# Patient Record
Sex: Male | Born: 1945 | Race: White | Hispanic: No | Marital: Married | Smoking: Former smoker
Health system: Southern US, Community
[De-identification: ages and names within clinical notes are randomized; demographics above are authoritative.]

## PROBLEM LIST (undated history)

## (undated) DIAGNOSIS — Z789 Other specified health status: Secondary | ICD-10-CM

## (undated) DIAGNOSIS — E119 Type 2 diabetes mellitus without complications: Secondary | ICD-10-CM

## (undated) DIAGNOSIS — F32A Depression, unspecified: Secondary | ICD-10-CM

## (undated) DIAGNOSIS — S42301A Unspecified fracture of shaft of humerus, right arm, initial encounter for closed fracture: Secondary | ICD-10-CM

## (undated) DIAGNOSIS — E782 Mixed hyperlipidemia: Secondary | ICD-10-CM

## (undated) DIAGNOSIS — I700 Atherosclerosis of aorta: Secondary | ICD-10-CM

## (undated) DIAGNOSIS — H269 Unspecified cataract: Secondary | ICD-10-CM

## (undated) DIAGNOSIS — I10 Essential (primary) hypertension: Secondary | ICD-10-CM

## (undated) DIAGNOSIS — N200 Calculus of kidney: Secondary | ICD-10-CM

## (undated) DIAGNOSIS — H3530 Unspecified macular degeneration: Secondary | ICD-10-CM

## (undated) HISTORY — DX: Unspecified fracture of shaft of humerus, right arm, initial encounter for closed fracture: S42.301A

## (undated) HISTORY — DX: Unspecified macular degeneration: H35.30

## (undated) HISTORY — DX: Atherosclerosis of aorta: I70.0

## (undated) HISTORY — PX: APPENDECTOMY: SHX54

## (undated) HISTORY — DX: Type 2 diabetes mellitus without complications: E11.9

## (undated) HISTORY — DX: Calculus of kidney: N20.0

## (undated) HISTORY — PX: OTHER SURGICAL HISTORY: SHX169

## (undated) HISTORY — DX: Depression, unspecified: F32.A

## (undated) HISTORY — PX: TONSILLECTOMY: SUR1361

## (undated) HISTORY — DX: Mixed hyperlipidemia: E78.2

## (undated) HISTORY — DX: Unspecified cataract: H26.9

## (undated) HISTORY — DX: Essential (primary) hypertension: I10

## (undated) HISTORY — DX: Other specified health status: Z78.9

## (undated) HISTORY — PX: COLONOSCOPY: SHX174

---

## 2015-07-26 ENCOUNTER — Encounter (INDEPENDENT_AMBULATORY_CARE_PROVIDER_SITE_OTHER): Payer: Self-pay | Admitting: Ophthalmology

## 2015-08-16 ENCOUNTER — Encounter (INDEPENDENT_AMBULATORY_CARE_PROVIDER_SITE_OTHER): Payer: Self-pay | Admitting: Ophthalmology

## 2017-12-09 ENCOUNTER — Encounter (INDEPENDENT_AMBULATORY_CARE_PROVIDER_SITE_OTHER): Payer: Self-pay | Admitting: Ophthalmology

## 2017-12-09 ENCOUNTER — Ambulatory Visit (INDEPENDENT_AMBULATORY_CARE_PROVIDER_SITE_OTHER): Payer: Medicare Other | Admitting: Ophthalmology

## 2017-12-09 DIAGNOSIS — H43811 Vitreous degeneration, right eye: Secondary | ICD-10-CM

## 2017-12-09 DIAGNOSIS — H25813 Combined forms of age-related cataract, bilateral: Secondary | ICD-10-CM

## 2017-12-09 DIAGNOSIS — H353122 Nonexudative age-related macular degeneration, left eye, intermediate dry stage: Secondary | ICD-10-CM | POA: Diagnosis not present

## 2017-12-09 DIAGNOSIS — H353211 Exudative age-related macular degeneration, right eye, with active choroidal neovascularization: Secondary | ICD-10-CM | POA: Diagnosis not present

## 2017-12-09 DIAGNOSIS — H3581 Retinal edema: Secondary | ICD-10-CM | POA: Diagnosis not present

## 2017-12-09 NOTE — Progress Notes (Signed)
[facility] - Clinic Note  12/09/2017     CHIEF COMPLAINT Patient presents for Retina Evaluation and Diabetic Eye Exam   HISTORY OF PRESENT ILLNESS: Timothy Pruitt is a 72 y.o. male who presents to the clinic today for:   HPI    Retina Evaluation    In both eyes.  This started 3 weeks ago.  Associated Symptoms Negative for Blind Spot, Glare, Shoulder/Hip pain, Fatigue, Jaw Claudication, Photophobia, Distortion, Floaters, Redness, Scalp Tenderness, Weight Loss, Fever, Trauma, Pain and Flashes.  Context:  distance vision, mid-range vision, near vision, reading and driving.  Treatments tried include laser.  Response to treatment was mild improvement.  I, the attending physician,  performed the HPI with the patient and updated documentation appropriately.          Diabetic Eye Exam    Vision is stable.  Associated Symptoms Negative for Blind Spot, Glare, Shoulder/Hip pain, Fatigue, Jaw Claudication, Photophobia, Distortion, Floaters, Redness, Scalp Tenderness, Weight Loss, Fever, Trauma, Pain and Flashes.  Diabetes characteristics include Type 2 and controlled with diet.  This started 6 years ago.  Blood sugar level is controlled.  Last Blood Glucose 159.  Last A1C 6.4.  I, the attending physician,  performed the HPI with the patient and updated documentation appropriately.          Comments    Referral of Dr. Delorise Shiner for retina eval. Patient states he has noticed in past 2-3 weeks his vision has been becoming more blurrier, mostly more so in the OD. Patient stastes he has noticed a spot in his vision OD. Patient is DM2, onset 5-6 yrs ago, his Bs are usually stable, diet controlled, BS 159 after dinner last night, last A1C 6.4. Patient reports he had laser sx OS appx 15 yrs ago with a little improvement noted.Denies gtt's/vit's       Last edited by Rennis Chris, MD on 12/09/2017 10:02 AM. (History)    Pt states he was seen By Dr. Lynelle Doctor for the first time yesterday; Pt  states 2 weeks ago he noticed blurred VA; Pt states he has been noticing wavy lines;   Referring physician: [name], OD [address] [place], [place] [address]  HISTORICAL INFORMATION:   Selected notes from the MEDICAL RECORD NUMBER Referred by Dr. [name] for concern of exudative ARMD OD LEE: 05.07.19 (K. Giusto) [BCVA: OD: 20/40 OS: 20/30-2] Ocular Hx-Exudative ARMD OD, Non-exudative ARMD OS, Cataract OU PMH-DM, HTN, Former Smoker    CURRENT MEDICATIONS: No current outpatient medications on file. (Ophthalmic Drugs)   No current facility-administered medications for this visit.  (Ophthalmic Drugs)   Current Outpatient Medications (Other)  Medication Sig  . aspirin EC 81 MG tablet 1 po q day  . buPROPion (WELLBUTRIN SR) 150 MG 12 hr tablet TK 1 T PO QD  . DOCOSAHEXAENOIC ACID PO Take by mouth.  . doxazosin (CARDURA) 4 MG tablet   . losartan (COZAAR) 100 MG tablet TK 1 T PO QD  . meloxicam (MOBIC) 15 MG tablet TK 1 T PO QD  . sildenafil (REVATIO) 20 MG tablet Take by mouth.   Current Facility-Administered Medications (Other)  Medication Route  . Bevacizumab (AVASTIN) SOLN 1.25 mg Intravitreal      REVIEW OF SYSTEMS: ROS    Positive for: Endocrine, Eyes   Negative for: Constitutional, Gastrointestinal, Neurological, Skin, Genitourinary, Musculoskeletal, HENT, Cardiovascular, Respiratory, Psychiatric, Allergic/Imm, Heme/Lymph   Last edited by Eldridge Scot, LPN on 02/09/2955  8:50 AM. (History)  ALLERGIES Allergies  Allergen Reactions  . Sulfa Antibiotics Hives  . Trazodone And Nefazodone Swelling    PAST MEDICAL HISTORY Past Medical History:  Diagnosis Date  . Diabetes [facility])    Past Surgical History:  Procedure Laterality Date  . APPENDECTOMY      FAMILY HISTORY Family History  Problem Relation Age of Onset  . Cataracts Mother   . Fuch's dystrophy Mother   . Glaucoma Mother   . Diabetes Brother     SOCIAL HISTORY Social History    Tobacco Use  . Smoking status: Former Games developer  . Smokeless tobacco: Never Used  Substance Use Topics  . Alcohol use: Yes    Alcohol/week: 0.6 oz    Types: 1 Cans of beer per week  . Drug use: Never         OPHTHALMIC EXAM:  Base Eye Exam    Visual Acuity (Snellen - Linear)      Right Left   Dist cc 20/30 -2 20/30 -2   Dist ph cc 20/30 -2 20/30 -2   Correction:  Glasses       Tonometry (Tonopen, 9:29 AM)      Right Left   Pressure 13 13       Pupils      Dark Light Shape React APD   Right 3 2 Round Brisk None   Left 3 2 Round Brisk None       Visual Fields (Counting fingers)      Left Right    Full Full       Extraocular Movement      Right Left    Full, Ortho Full, Ortho       Neuro/Psych    Oriented x3:  Yes   Mood/Affect:  Normal       Dilation    Both eyes:  1.0% Mydriacyl, 2.5% Phenylephrine @ 9:28 AM        Slit Lamp and Fundus Exam    Slit Lamp Exam      Right Left   Lids/Lashes Dermatochalasis - upper lid Dermatochalasis - upper lid   Conjunctiva/Sclera White and quiet White and quiet   Cornea Arcus, otherwise clear Arcus, otherwise clear   Anterior Chamber Deep and quiet Deep and quiet   Iris Round and dilated Round and dilated   Lens 2+ Nuclear sclerosis, 2-3+ Cortical cataract 2+ Nuclear sclerosis, 2-3+ Cortical cataract   Vitreous Vitreous syneresis, Posterior vitreous detachment Vitreous syneresis       Fundus Exam      Right Left   Disc Compact, mild Temporal Peripapillary atrophy Pink and Sharp, mild Temporal Peripapillary atrophy   C/D Ratio 0.1 0.1   Macula Blunted foveal reflex, central PED with heme, RPE mottling and clumping, Drusen Blunted foveal reflex, Epiretinal membrane, RPE mottling /  Clumping / atophy, No heme or edema   Vessels Vascular attenuation Vascular attenuation   Periphery Attached, scattered RPE changes Attached, scattered peripherla drusen         Refraction    Wearing Rx      Sphere Cylinder Axis    Right -3.25 +0.50 016   Left -2.75 Sphere    Age:  2 yrs   Type:  SVL       Manifest Refraction      Sphere Cylinder Axis Dist VA   Right -3.00 +1.00 003 20/30   Left -2.75 +1.00 163 20/30          IMAGING AND PROCEDURES  Imaging and  Procedures for @  OCT, Retina - OU - Both Eyes       Right Eye Quality was good. Central Foveal Thickness: 361. Progression has no prior data. Findings include abnormal foveal contour, no IRF, subretinal fluid, pigment epithelial detachment, retinal drusen .   Left Eye Quality was good. Central Foveal Thickness: 307. Progression has no prior data. Findings include normal foveal contour, epiretinal membrane, retinal drusen , pigment epithelial detachment, outer retinal atrophy, no SRF, no IRF.   Notes *Images captured and stored on drive  Diagnosis / Impression:  OD: Exudative ARMD OS: Non- Exudative ARMD with early atrophy; ERM  Clinical management:  See below  Abbreviations: NFP - Normal foveal profile. CME - cystoid macular edema. PED - pigment epithelial detachment. IRF - intraretinal fluid. SRF - subretinal fluid. EZ - ellipsoid zone. ERM - epiretinal membrane. ORA - outer retinal atrophy. ORT - outer retinal tubulation. SRHM - subretinal hyper-reflective material         Intravitreal Injection, Pharmacologic Agent - OD - Right Eye       Time Out 12/09/2017. 10:59 AM. Confirmed correct patient, procedure, site, and patient consented.   Anesthesia Topical anesthesia was used. Anesthetic medications included Lidocaine 2%, Tetracaine 0.5%.   Procedure Preparation included 5% betadine to ocular surface. A supplied needle was used.   Injection: 1.25 mg Bevacizumab (AVASTIN) SOLN 1.25 mg   NDC: 16109-604-54    Lot: [id number]     Expiration Date: 01/27/2018   Route: Intravitreal   Site: Right Eye   Waste: 0 mg  Post-op Post injection exam found visual acuity of at least counting fingers. The patient tolerated the  procedure well. There were no complications. The patient received written and verbal post procedure care education.                 ASSESSMENT/PLAN:    ICD-10-CM   1. 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 Retinal edema H35.81 OCT, Retina - OU - Both Eyes  3. Intermediate stage nonexudative age-related macular degeneration of left eye H35.3122   4. Posterior vitreous detachment of right eye H43.811   5. 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 Exudative age related macular degeneration, OD  - The incidence pathology and anatomy of wet AMD discussed   - The ANCHOR, MARINA, CATT and VIEW trials discussed with patient.    - discussed treatment options including observation vs intravitreal anti-VEGF agents such as Avastin, Lucentis, Eylea.    - Risks of endophthalmitis and vascular occlusive events and atrophic changes discussed with patient  - pt endorses metamorphopsia   - OCT OD with PED and overlying SRF + drusen  - recommend IVA #1 OD  - pt wishes to be treated with IVA OD today (05.08.19)  - RBA of procedure discussed, questions answered  - informed consent obtained and signed  - see procedure note  - f/u in 4 wks  3. Age related macular degeneration, non-exudative, OS  - The incidence, anatomy, and pathology of dry AMD, risk of progression, and the AREDS and AREDS 2 study including smoking risks discussed with patient.  - Recommend amsler grid monitoring  4. PVD / vitreous syneresis  Discussed findings and prognosis  No RT or RD on 360 peripheral exam  Reviewed s/s of RT/RD  Strict return precautions  for any such RT/RD signs/symptoms  5. Combined form age-related cataract OU-  - The symptoms of cataract, surgical options, and treatments and risks were discussed with  patient. - discussed diagnosis and progression - not yet visually significant - monitor for now  Ophthalmic Meds Ordered this visit:  Meds ordered this encounter  Medications  . Bevacizumab (AVASTIN) SOLN 1.25 mg       Return in about 1 month (around 01/06/2018) for F/U Exu AMD OD, DFE, OCT.  There are no Patient Instructions on file for this visit.   Explained the diagnoses, plan, and follow up with the patient and they expressed understanding.  Patient expressed understanding of the importance of proper follow up care.   This document serves as a record of services personally performed by Karie Chimera, MD, PhD. It was created on their behalf by Laurian Brim, OA, an ophthalmic assistant. The creation of this record is the provider's dictation and/or activities during the visit.    Electronically signed by: Laurian Brim, OA  12/09/2017 3:23 PM   This document serves as a record of services personally performed by Karie Chimera, MD, PhD. It was created on their behalf by Virgilio Belling, COA, a certified ophthalmic assistant. The creation of this record is the provider's dictation and/or activities during the visit.  Electronically signed by: Virgilio Belling, COA  05.08.19 3:23 PM    Karie Chimera, M.D., Ph.D. Diseases & Surgery of the Retina and Vitreous [facility] 05.08.19  I have reviewed the above documentation for accuracy and completeness, and I agree with the above. Karie Chimera, M.D., Ph.D. 12/12/17 3:24 PM     Abbreviations: M myopia (nearsighted); A astigmatism; H hyperopia (farsighted); P presbyopia; Mrx spectacle prescription;  CTL contact lenses; OD right eye; OS left eye; OU both eyes  XT exotropia; ET esotropia; PEK punctate epithelial keratitis; PEE punctate epithelial erosions; DES dry eye syndrome; MGD meibomian gland dysfunction; ATs artificial tears; PFAT's preservative free artificial tears; NSC nuclear sclerotic cataract;  PSC posterior subcapsular cataract; ERM epi-retinal membrane; PVD posterior vitreous detachment; RD retinal detachment; DM diabetes mellitus; DR diabetic retinopathy; NPDR non-proliferative diabetic retinopathy; PDR proliferative diabetic retinopathy; CSME clinically significant macular edema; DME diabetic macular edema; dbh dot blot hemorrhages; CWS cotton wool spot; POAG primary open angle glaucoma; C/D cup-to-disc ratio; HVF humphrey visual field; GVF goldmann visual field; OCT optical coherence tomography; IOP intraocular pressure; BRVO Branch retinal vein occlusion; CRVO central retinal vein occlusion; CRAO central retinal artery occlusion; BRAO branch retinal artery occlusion; RT retinal tear; SB scleral buckle; PPV pars plana vitrectomy; VH Vitreous hemorrhage; PRP panretinal laser photocoagulation; IVK intravitreal kenalog; VMT vitreomacular traction; MH Macular hole;  NVD neovascularization of the disc; NVE neovascularization elsewhere; AREDS age related eye disease study; ARMD age related macular degeneration; POAG primary open angle glaucoma; EBMD epithelial/anterior basement membrane dystrophy; ACIOL anterior chamber intraocular lens; IOL intraocular lens; PCIOL posterior chamber intraocular lens; Phaco/IOL phacoemulsification with intraocular lens placement; PRK photorefractive keratectomy; LASIK laser assisted in situ keratomileusis; HTN hypertension; DM diabetes mellitus; COPD chronic obstructive pulmonary disease

## 2017-12-12 ENCOUNTER — Encounter (INDEPENDENT_AMBULATORY_CARE_PROVIDER_SITE_OTHER): Payer: Self-pay | Admitting: Ophthalmology

## 2017-12-12 DIAGNOSIS — H353211 Exudative age-related macular degeneration, right eye, with active choroidal neovascularization: Secondary | ICD-10-CM | POA: Diagnosis not present

## 2017-12-12 MED ORDER — BEVACIZUMAB CHEMO INJECTION 1.25MG/0.05ML SYRINGE FOR KALEIDOSCOPE
1.2500 mg | INTRAVITREAL | Status: DC
  Administered 2017-12-12: 1.25 mg via INTRAVITREAL

## 2018-01-06 NOTE — Progress Notes (Signed)
[facility] - Clinic Note  01/07/2018     CHIEF COMPLAINT Patient presents for Retina Follow Up   HISTORY OF PRESENT ILLNESS: Timothy Pruitt is a 72 y.o. male who presents to the clinic today for:   HPI    Retina Follow Up    Patient presents with  Wet AMD.  In right eye.  Severity is mild.  Since onset it is gradually improving.  I, the attending physician,  performed the HPI with the patient and updated documentation appropriately.          Comments    F/U EXU AMD OD. Patient states his vision is gradually getting better, wavy lines and blurriness has decreased. Bs 138( 1 wk ago), A1C 6.8(01/06/18) Pt started Areds 2 PO QD. Patient is ready for Avastin if indicted.       Last edited by Rennis ChrisZamora, Athan Casalino, MD on 01/07/2018 10:28 AM. (History)    Pt states after first injection he was able to see "lines in the road that weren't squiggly"; Pt states he tolerated injection well;   Referring physician: No referring provider defined for this encounter.  HISTORICAL INFORMATION:   Selected notes from the MEDICAL RECORD NUMBER Referred by Dr. Jonny RuizKenneth Giusto for concern of exudative ARMD OD LEE: 05.07.19 (K. Giusto) [BCVA: OD: 20/40 OS: 20/30-2] Ocular Hx-Exudative ARMD OD, Non-exudative ARMD OS, Cataract OU PMH-DM, HTN, Former Smoker    CURRENT MEDICATIONS: No current outpatient medications on file. (Ophthalmic Drugs)   No current facility-administered medications for this visit.  (Ophthalmic Drugs)   Current Outpatient Medications (Other)  Medication Sig  . aspirin EC 81 MG tablet 1 po q day  . buPROPion (WELLBUTRIN SR) 150 MG 12 hr tablet TK 1 T PO QD  . DOCOSAHEXAENOIC ACID PO Take by mouth.  . doxazosin (CARDURA) 4 MG tablet   . losartan (COZAAR) 100 MG tablet TK 1 T PO QD  . meloxicam (MOBIC) 15 MG tablet TK 1 T PO QD  . Multiple Vitamins-Minerals (ICAPS AREDS 2 PO) Take by mouth.  . sildenafil (REVATIO) 20 MG tablet Take by mouth.   Current  Facility-Administered Medications (Other)  Medication Route  . Bevacizumab (AVASTIN) SOLN 1.25 mg Intravitreal  . Bevacizumab (AVASTIN) SOLN 1.25 mg Intravitreal      REVIEW OF SYSTEMS: ROS    Positive for: Endocrine, Eyes   Negative for: Constitutional, Gastrointestinal, Neurological, Skin, Genitourinary, Musculoskeletal, HENT, Cardiovascular, Respiratory, Psychiatric, Allergic/Imm, Heme/Lymph   Last edited by Eldridge ScotKendrick, Glenda, LPN on 1/6/10966/01/2018  9:54 AM. (History)       ALLERGIES Allergies  Allergen Reactions  . Sulfa Antibiotics Hives  . Trazodone And Nefazodone Swelling    PAST MEDICAL HISTORY Past Medical History:  Diagnosis Date  . Diabetes [facility](HCC)    Past Surgical History:  Procedure Laterality Date  . APPENDECTOMY      FAMILY HISTORY Family History  Problem Relation Age of Onset  . Cataracts Mother   . Fuch's dystrophy Mother   . Glaucoma Mother   . Diabetes Brother     SOCIAL HISTORY Social History   Tobacco Use  . Smoking status: Former Games developermoker  . Smokeless tobacco: Never Used  Substance Use Topics  . Alcohol use: Yes    Alcohol/week: 0.6 oz    Types: 1 Cans of beer per week  . Drug use: Never         OPHTHALMIC EXAM:  Base Eye Exam    Visual Acuity (Snellen - Linear)  Right Left   Dist cc 20/30 -2 20/40 -   Dist ph cc NI NI   Correction:  Glasses       Tonometry (Tonopen, 10:02 AM)      Right Left   Pressure 13 11       Pupils      Dark Light Shape React APD   Right 4 3 Round Brisk None   Left 4 3 Round Brisk None       Visual Fields (Counting fingers)      Left Right    Full Full       Extraocular Movement      Right Left    Full, Ortho Full, Ortho       Neuro/Psych    Oriented x3:  Yes   Mood/Affect:  Normal       Dilation    Both eyes:  1.0% Mydriacyl, 2.5% Phenylephrine @ 10:02 AM        Slit Lamp and Fundus Exam    Slit Lamp Exam      Right Left   Lids/Lashes Dermatochalasis - upper lid  Dermatochalasis - upper lid   Conjunctiva/Sclera White and quiet White and quiet   Cornea Arcus, otherwise clear Arcus, otherwise clear   Anterior Chamber Deep and quiet Deep and quiet   Iris Round and dilated Round and dilated   Lens 2+ Nuclear sclerosis, 2-3+ Cortical cataract 2+ Nuclear sclerosis, 2-3+ Cortical cataract   Vitreous Vitreous syneresis, Posterior vitreous detachment Vitreous syneresis       Fundus Exam      Right Left   Disc Compact, mild Temporal Peripapillary atrophy Pink and Sharp, mild Temporal Peripapillary atrophy   C/D Ratio 0.1 0.1   Macula Blunted foveal reflex, central PED with heme - heme improving, RPE mottling and clumping, Drusen Blunted foveal reflex, Epiretinal membrane, RPE mottling /  Clumping / atophy, No heme or edema   Vessels Vascular attenuation Vascular attenuation   Periphery Attached, scattered RPE changes Attached, scattered peripherla drusen           IMAGING AND PROCEDURES  Imaging and Procedures for @TODAY @  OCT, Retina - OU - Both Eyes       Right Eye Quality was good. Central Foveal Thickness: 330. Progression has improved. Findings include abnormal foveal contour, no IRF, pigment epithelial detachment, retinal drusen , no SRF, outer retinal atrophy.   Left Eye Quality was good. Central Foveal Thickness: 306. Progression has been stable. Findings include normal foveal contour, epiretinal membrane, retinal drusen , pigment epithelial detachment, outer retinal atrophy, no SRF, no IRF, subretinal hyper-reflective material.   Notes *Images captured and stored on drive  Diagnosis / Impression:  OD: Exudative ARMD - interval improvement in SRF OS: Non- Exudative ARMD with early atrophy; ERM  Clinical management:  See below  Abbreviations: NFP - Normal foveal profile. CME - cystoid macular edema. PED - pigment epithelial detachment. IRF - intraretinal fluid. SRF - subretinal fluid. EZ - ellipsoid zone. ERM - epiretinal membrane.  ORA - outer retinal atrophy. ORT - outer retinal tubulation. SRHM - subretinal hyper-reflective material         Intravitreal Injection, Pharmacologic Agent - OD - Right Eye       Time Out 01/07/2018. 10:51 AM. Confirmed correct patient, procedure, site, and patient consented.   Anesthesia Topical anesthesia was used. Anesthetic medications included Lidocaine 2%, Proparacaine 0.5%.   Procedure Preparation included 5% betadine to ocular surface. A supplied needle was used.  Injection: 1.25 mg Bevacizumab 1.25mg /0.40ml   NDC: 16109-604-54    Lot: [id number]     Expiration Date: 03/11/2018   Route: Intravitreal   Site: Right Eye   Waste: 0 mg  Post-op Post injection exam found visual acuity of at least counting fingers. The patient tolerated the procedure well. There were no complications. The patient received written and verbal post procedure care education.                 ASSESSMENT/PLAN:    ICD-10-CM   1. 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 Retinal edema H35.81 OCT, Retina - OU - Both Eyes  3. Intermediate stage nonexudative age-related macular degeneration of left eye H35.3122   4. Posterior vitreous detachment of right eye H43.811   5. 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 Exudative age related macular degeneration, OD  - at initial presentation, pt endorsed metamorphopsia   - OCT OD with PED and overlying SRF + drusen  - S/P IVA OD #1 (05.08.19) -- excellent early response!  - today, pt reports subjective resolution of metamorphopsia and OCT shows interval resolution of SRF -- still with PED  - recommend IVA #2 OD  - pt wishes to be treated with IVA #2 OD today (06.06.19)  - RBA of procedure discussed, questions answered  - informed consent obtained  and signed  - see procedure note  - f/u in 4 wks  3. Age related macular degeneration, non-exudative, OS  - The incidence, anatomy, and pathology of dry AMD, risk of progression, and the AREDS and AREDS 2 study including smoking risks discussed with patient.  - continue amsler gird monitoring  4. PVD / vitreous syneresis  Discussed findings and prognosis  No RT or RD on 360 peripheral exam  Reviewed s/s of RT/RD  Strict return precautions for any such RT/RD signs/symptoms  5. Combined form age-related cataract OU-  - The symptoms of cataract, surgical options, and treatments and risks were discussed with patient. - discussed diagnosis and progression - not yet visually significant - monitor for now  Ophthalmic Meds Ordered this visit:  Meds ordered this encounter  Medications  . Bevacizumab (AVASTIN) SOLN 1.25 mg       Return in about 1 month (around 02/04/2018) for F/U Exu AMD OD, DFE, OCT.  There are no Patient Instructions on file for this visit.   Explained the diagnoses, plan, and follow up with the patient and they expressed understanding.  Patient expressed understanding of the importance of proper follow up care.   This document serves as a record of services personally performed by Karie Chimera, MD, PhD. It was created on their behalf by Virgilio Belling, COA, a certified ophthalmic assistant. The creation of this record is the provider's dictation and/or activities during the visit.  Electronically signed by: Virgilio Belling, COA  06.05.19 1:02 PM   Karie Chimera, M.D., Ph.D. Diseases & Surgery of the Retina and Vitreous [facility]  I have reviewed the above documentation for accuracy and completeness, and I agree with the above. Karie Chimera, M.D., Ph.D. 01/08/18 1:04 PM   Abbreviations: M myopia (nearsighted); A astigmatism; H hyperopia (farsighted); P presbyopia; Mrx spectacle prescription;  CTL contact lenses; OD right eye; OS  left eye; OU both eyes  XT exotropia; ET esotropia; PEK punctate epithelial keratitis; PEE punctate epithelial erosions; DES dry eye syndrome; MGD meibomian gland dysfunction; ATs artificial tears; PFAT's preservative free artificial tears; [place] nuclear sclerotic cataract; PSC posterior subcapsular cataract; ERM epi-retinal membrane; PVD posterior vitreous detachment; RD retinal detachment; DM diabetes mellitus; DR diabetic retinopathy; NPDR non-proliferative diabetic retinopathy; PDR proliferative diabetic retinopathy; CSME clinically significant macular edema; DME diabetic macular edema; dbh dot blot hemorrhages; CWS cotton wool spot; POAG primary open angle glaucoma; C/D cup-to-disc ratio; HVF humphrey visual field; GVF goldmann visual field; OCT optical coherence tomography; IOP intraocular pressure; BRVO Branch retinal vein occlusion; CRVO central retinal vein occlusion; CRAO central retinal artery occlusion; BRAO branch retinal artery occlusion; RT retinal tear; SB scleral buckle; PPV pars plana vitrectomy; VH Vitreous hemorrhage; PRP panretinal laser photocoagulation; IVK intravitreal kenalog; VMT vitreomacular traction; MH Macular hole;  NVD neovascularization of the disc; NVE neovascularization elsewhere; AREDS age related eye disease study; ARMD age related macular degeneration; POAG primary open angle glaucoma; EBMD epithelial/anterior basement membrane dystrophy; ACIOL anterior chamber intraocular lens; IOL intraocular lens; PCIOL posterior chamber intraocular lens; Phaco/IOL phacoemulsification with intraocular lens placement; [place] photorefractive keratectomy; LASIK laser assisted in situ keratomileusis; HTN hypertension; DM diabetes mellitus; COPD chronic obstructive pulmonary disease

## 2018-01-07 ENCOUNTER — Ambulatory Visit (INDEPENDENT_AMBULATORY_CARE_PROVIDER_SITE_OTHER): Payer: Medicare Other | Admitting: Ophthalmology

## 2018-01-07 ENCOUNTER — Encounter (INDEPENDENT_AMBULATORY_CARE_PROVIDER_SITE_OTHER): Payer: Self-pay | Admitting: Ophthalmology

## 2018-01-07 DIAGNOSIS — H353211 Exudative age-related macular degeneration, right eye, with active choroidal neovascularization: Secondary | ICD-10-CM

## 2018-01-07 DIAGNOSIS — H353122 Nonexudative age-related macular degeneration, left eye, intermediate dry stage: Secondary | ICD-10-CM

## 2018-01-07 DIAGNOSIS — H43811 Vitreous degeneration, right eye: Secondary | ICD-10-CM

## 2018-01-07 DIAGNOSIS — H25813 Combined forms of age-related cataract, bilateral: Secondary | ICD-10-CM

## 2018-01-07 DIAGNOSIS — H3581 Retinal edema: Secondary | ICD-10-CM | POA: Diagnosis not present

## 2018-01-07 MED ORDER — BEVACIZUMAB CHEMO INJECTION 1.25MG/0.05ML SYRINGE FOR KALEIDOSCOPE
1.2500 mg | INTRAVITREAL | Status: DC
  Administered 2018-01-07: 1.25 mg via INTRAVITREAL

## 2018-01-08 ENCOUNTER — Encounter (INDEPENDENT_AMBULATORY_CARE_PROVIDER_SITE_OTHER): Payer: Self-pay | Admitting: Ophthalmology

## 2018-02-02 NOTE — Progress Notes (Signed)
[facility] - Clinic Note  02/03/2018     CHIEF COMPLAINT Patient presents for Retina Follow Up   HISTORY OF PRESENT ILLNESS: Carney CornersLarry Pruitt is a 72 y.o. male who presents to the clinic today for:   HPI    Retina Follow Up    Patient presents with  Wet AMD.  In right eye.  This started months ago.  Severity is moderate.  Duration of months.  Since onset it is stable.  I, the attending physician,  performed the HPI with the patient and updated documentation appropriately.          Comments    72 y/o male pt here for 1 mo f/u for wet AMD OD w/active choroidal neovascularization.  Feels vision OU may be a little better since last visit.  Denies pain, flashes, but reports a few small floaters OU.  No gtts.       Last edited by Rennis ChrisZamora, Laporsha Grealish, MD on 02/03/2018 11:14 AM. (History)      Referring physician: Theodoro KosLewis, William B, MD [address] [place], [place] [id number]  HISTORICAL INFORMATION:   Selected notes from the MEDICAL RECORD NUMBER Referred by Dr. Jonny RuizKenneth Giusto for concern of exudative ARMD OD LEE: 05.07.19 (K. Giusto) [BCVA: OD: 20/40 OS: 20/30-2] Ocular Hx-Exudative ARMD OD, Non-exudative ARMD OS, Cataract OU PMH-DM, HTN, Former Smoker    CURRENT MEDICATIONS: No current outpatient medications on file. (Ophthalmic Drugs)   No current facility-administered medications for this visit.  (Ophthalmic Drugs)   Current Outpatient Medications (Other)  Medication Sig  . amoxicillin-clavulanate (AUGMENTIN) 875-125 MG tablet TK 1 T PO  BID WITH FOOD FOR INFECTION  . aspirin EC 81 MG tablet 1 po q day  . buPROPion (WELLBUTRIN SR) 150 MG 12 hr tablet TK 1 T PO QD  . DOCOSAHEXAENOIC ACID PO Take by mouth.  . doxazosin (CARDURA) 4 MG tablet   . losartan (COZAAR) 100 MG tablet TK 1 T PO QD  . meloxicam (MOBIC) 15 MG tablet TK 1 T PO QD  . Multiple Vitamins-Minerals (ICAPS AREDS 2 PO) Take by mouth.  . sildenafil (REVATIO) 20 MG tablet Take by mouth.   Current  Facility-Administered Medications (Other)  Medication Route  . Bevacizumab (AVASTIN) SOLN 1.25 mg Intravitreal  . Bevacizumab (AVASTIN) SOLN 1.25 mg Intravitreal  . Bevacizumab (AVASTIN) SOLN 1.25 mg Intravitreal      REVIEW OF SYSTEMS: ROS    Positive for: Eyes   Negative for: Constitutional, Gastrointestinal, Neurological, Skin, Genitourinary, Musculoskeletal, HENT, Endocrine, Cardiovascular, Respiratory, Psychiatric, Allergic/Imm, Heme/Lymph   Last edited by Celine MansBaxley, Andrew G, COA on 02/03/2018 10:05 AM. (History)       ALLERGIES Allergies  Allergen Reactions  . Sulfa Antibiotics Hives  . Trazodone And Nefazodone Swelling    PAST MEDICAL HISTORY Past Medical History:  Diagnosis Date  . Diabetes [facility](HCC)    Past Surgical History:  Procedure Laterality Date  . APPENDECTOMY      FAMILY HISTORY Family History  Problem Relation Age of Onset  . Cataracts Mother   . Fuch's dystrophy Mother   . Glaucoma Mother   . Diabetes Brother     SOCIAL HISTORY Social History   Tobacco Use  . Smoking status: Former Games developermoker  . Smokeless tobacco: Never Used  Substance Use Topics  . Alcohol use: Yes    Alcohol/week: 0.6 oz    Types: 1 Cans of beer per week  . Drug use: Never  OPHTHALMIC EXAM:  Base Eye Exam    Visual Acuity (Snellen - Linear)      Right Left   Dist cc 20/30 20/30 -2   Dist ph cc NI NI   Correction:  Glasses       Tonometry (Tonopen, 10:13 AM)      Right Left   Pressure 14 13  Squeezing       Pupils      Dark Light Shape React APD   Right 4 3 Round Brisk None   Left 4 3 Round Brisk None       Visual Fields (Counting fingers)      Left Right    Full Full       Extraocular Movement      Right Left    Full, Ortho Full, Ortho       Neuro/Psych    Oriented x3:  Yes   Mood/Affect:  Normal       Dilation    Both eyes:  1.0% Mydriacyl, 2.5% Phenylephrine @ 10:13 AM        Slit Lamp and Fundus Exam    Slit Lamp Exam       Right Left   Lids/Lashes Dermatochalasis - upper lid Dermatochalasis - upper lid   Conjunctiva/Sclera White and quiet White and quiet   Cornea Arcus, otherwise clear Arcus, otherwise clear   Anterior Chamber Deep and quiet Deep and quiet   Iris Round and dilated Round and dilated   Lens 2+ Nuclear sclerosis, 2-3+ Cortical cataract 2+ Nuclear sclerosis, 2-3+ Cortical cataract   Vitreous Vitreous syneresis, Posterior vitreous detachment Vitreous syneresis       Fundus Exam      Right Left   Disc Compact, mild Temporal Peripapillary atrophy Pink and Sharp, mild Temporal Peripapillary atrophy   C/D Ratio 0.1 0.1   Macula Blunted foveal reflex, central PED with heme improving - essentially resolved, RPE mottling and clumping, Drusen Blunted foveal reflex, Epiretinal membrane, RPE mottling /  Clumping / atophy, No heme or edema   Vessels Vascular attenuation Vascular attenuation   Periphery Attached, scattered RPE changes Attached, scattered peripheral drusen           IMAGING AND PROCEDURES  Imaging and Procedures for @TODAY @  OCT, Retina - OU - Both Eyes       Right Eye Quality was good. Central Foveal Thickness: 330. Progression has improved. Findings include abnormal foveal contour, no IRF, pigment epithelial detachment, retinal drusen , no SRF, outer retinal atrophy.   Left Eye Quality was good. Central Foveal Thickness: 278. Progression has been stable. Findings include normal foveal contour, epiretinal membrane, retinal drusen , pigment epithelial detachment, outer retinal atrophy, no SRF, no IRF, subretinal hyper-reflective material.   Notes *Images captured and stored on drive  Diagnosis / Impression:  OD: Exudative ARMD - stable resolution of SRF OS: Non- Exudative ARMD with early atrophy; ERM  Clinical management:  See below  Abbreviations: NFP - Normal foveal profile. CME - cystoid macular edema. PED - pigment epithelial detachment. IRF - intraretinal fluid. SRF -  subretinal fluid. EZ - ellipsoid zone. ERM - epiretinal membrane. ORA - outer retinal atrophy. ORT - outer retinal tubulation. SRHM - subretinal hyper-reflective material         Intravitreal Injection, Pharmacologic Agent - OD - Right Eye       Time Out 02/03/2018. 11:17 AM. Confirmed correct patient, procedure, site, and patient consented.   Anesthesia Topical anesthesia was used. Anesthetic medications included  Lidocaine 2%, Tetracaine 0.5%.   Procedure Preparation included 5% betadine to ocular surface, eyelid speculum. A supplied needle was used.   Injection: 1.25 mg Bevacizumab 1.25mg /0.36ml   NDC: 70360-001-02    Lot: [id number]     Expiration Date: 03/18/2018   Route: Intravitreal   Site: Right Eye   Waste: 0 mg  Post-op Post injection exam found visual acuity of at least counting fingers. The patient tolerated the procedure well. There were no complications. The patient received written and verbal post procedure care education.                 ASSESSMENT/PLAN:    ICD-10-CM   1. Exudative age-related macular degeneration of right eye with active choroidal neovascularization (HCC) H35.3211 OCT, Retina - OU - Both Eyes    Intravitreal Injection, Pharmacologic Agent - OD - Right Eye    Bevacizumab (AVASTIN) SOLN 1.25 mg  2. Retinal edema H35.81 OCT, Retina - OU - Both Eyes  3. Intermediate stage nonexudative age-related macular degeneration of left eye H35.3122   4. Posterior vitreous detachment of right eye H43.811   5. 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 Exudative age related macular degeneration, OD  - at initial presentation, pt endorsed metamorphopsia   - OCT OD with PED and overlying SRF + drusen  - S/P IVA OD #1 (05.08.19) -- excellent early response!, #2 (06.06.19)  - today, pt reports stable resolution of metamorphopsia and OCT shows interval resolution of SRF -- still with PED  - recommend IVA #3 OD with extension  to 6 wks  - pt wishes to be treated with IVA #3 OD today (07.03.19)  - RBA of procedure discussed, questions answered  - informed consent obtained and signed  - see procedure note  - will extend follow up  - f/u in 6 wks  3. Age related macular degeneration, non-exudative, OS  - The incidence, anatomy, and pathology of dry AMD, risk of progression, and the AREDS and AREDS 2 study including smoking risks discussed with patient.  - continue amsler grid monitoring  4. PVD / vitreous syneresis  Discussed findings and prognosis  No RT or RD on 360 peripheral exam  Reviewed s/s of RT/RD  Strict return precautions for any such RT/RD signs/symptoms  5. Combined form age-related cataract OU-  - The symptoms of cataract, surgical options, and treatments and risks were discussed with patient. - discussed diagnosis and progression - not yet visually significant - monitor for now   Ophthalmic Meds Ordered this visit:  Meds ordered this encounter  Medications  . Bevacizumab (AVASTIN) SOLN 1.25 mg       Return in about 6 weeks (around 03/17/2018) for F/U exu AMD OD, DFE, OCT.  There are no Patient Instructions on file for this visit.   Explained the diagnoses, plan, and follow up with the patient and they expressed understanding.  Patient expressed understanding of the importance of proper follow up care.   This document serves as a record of services personally performed by Karie Chimera, MD, PhD. It was created on their behalf by Virgilio Belling, COA, a certified ophthalmic assistant. The creation of this record is the provider's dictation and/or activities during the visit.  Electronically signed by: Virgilio Belling, COA  07.02.19 12:39 PM    Karie Chimera, M.D., Ph.D. Diseases & Surgery of the Retina and Vitreous [facility]  I have reviewed the above documentation for  accuracy and completeness, and I agree with the above. Karie Chimera, M.D., Ph.D.  02/03/18 12:39 PM   Abbreviations: M myopia (nearsighted); A astigmatism; H hyperopia (farsighted); P presbyopia; Mrx spectacle prescription;  CTL contact lenses; OD right eye; OS left eye; OU both eyes  XT exotropia; ET esotropia; PEK punctate epithelial keratitis; PEE punctate epithelial erosions; DES dry eye syndrome; MGD meibomian gland dysfunction; ATs artificial tears; PFAT's preservative free artificial tears; [place] nuclear sclerotic cataract; PSC posterior subcapsular cataract; ERM epi-retinal membrane; PVD posterior vitreous detachment; RD retinal detachment; DM diabetes mellitus; DR diabetic retinopathy; NPDR non-proliferative diabetic retinopathy; PDR proliferative diabetic retinopathy; CSME clinically significant macular edema; DME diabetic macular edema; dbh dot blot hemorrhages; CWS cotton wool spot; POAG primary open angle glaucoma; C/D cup-to-disc ratio; HVF humphrey visual field; GVF goldmann visual field; OCT optical coherence tomography; IOP intraocular pressure; BRVO Branch retinal vein occlusion; CRVO central retinal vein occlusion; CRAO central retinal artery occlusion; BRAO branch retinal artery occlusion; RT retinal tear; SB scleral buckle; PPV pars plana vitrectomy; VH Vitreous hemorrhage; PRP panretinal laser photocoagulation; IVK intravitreal kenalog; VMT vitreomacular traction; MH Macular hole;  NVD neovascularization of the disc; NVE neovascularization elsewhere; AREDS age related eye disease study; ARMD age related macular degeneration; POAG primary open angle glaucoma; EBMD epithelial/anterior basement membrane dystrophy; ACIOL anterior chamber intraocular lens; IOL intraocular lens; PCIOL posterior chamber intraocular lens; Phaco/IOL phacoemulsification with intraocular lens placement; PRK photorefractive keratectomy; LASIK laser assisted in situ keratomileusis; HTN hypertension; DM diabetes mellitus; COPD chronic obstructive pulmonary disease

## 2018-02-03 ENCOUNTER — Ambulatory Visit (INDEPENDENT_AMBULATORY_CARE_PROVIDER_SITE_OTHER): Payer: Medicare Other | Admitting: Ophthalmology

## 2018-02-03 ENCOUNTER — Encounter (INDEPENDENT_AMBULATORY_CARE_PROVIDER_SITE_OTHER): Payer: Self-pay | Admitting: Ophthalmology

## 2018-02-03 DIAGNOSIS — H353211 Exudative age-related macular degeneration, right eye, with active choroidal neovascularization: Secondary | ICD-10-CM | POA: Diagnosis not present

## 2018-02-03 DIAGNOSIS — H25813 Combined forms of age-related cataract, bilateral: Secondary | ICD-10-CM

## 2018-02-03 DIAGNOSIS — H3581 Retinal edema: Secondary | ICD-10-CM | POA: Diagnosis not present

## 2018-02-03 DIAGNOSIS — H353122 Nonexudative age-related macular degeneration, left eye, intermediate dry stage: Secondary | ICD-10-CM

## 2018-02-03 DIAGNOSIS — H43811 Vitreous degeneration, right eye: Secondary | ICD-10-CM

## 2018-02-03 MED ORDER — BEVACIZUMAB CHEMO INJECTION 1.25MG/0.05ML SYRINGE FOR KALEIDOSCOPE
1.2500 mg | INTRAVITREAL | Status: DC
  Administered 2018-02-03: 1.25 mg via INTRAVITREAL

## 2018-03-15 NOTE — Progress Notes (Signed)
[facility] - Clinic Note  03/16/2018     CHIEF COMPLAINT Patient presents for Retina Follow Up   HISTORY OF PRESENT ILLNESS: Timothy Pruitt is a 72 y.o. male who presents to the clinic today for:   HPI    Retina Follow Up    Patient presents with  Wet AMD.  In right eye.  This started 3 months ago.  Severity is mild.  Since onset it is stable.  I, the attending physician,  performed the HPI with the patient and updated documentation appropriately.          Comments    F/U EXU AMD OD. Patient states his vision is"about the same", denies new visual issues. Pt is ready for tx today if indicted.       Last edited by Rennis Chris, MD on 03/16/2018 12:19 PM. (History)      Referring physician: Theodoro Kos, MD [id number] [place], [place] [address]  HISTORICAL INFORMATION:   Selected notes from the MEDICAL RECORD NUMBER Referred by Dr. [name] for concern of exudative ARMD OD LEE: 05.07.19 (K. Giusto) [BCVA: OD: 20/40 OS: 20/30-2] Ocular Hx-Exudative ARMD OD, Non-exudative ARMD OS, Cataract OU PMH-DM, HTN, Former Smoker    CURRENT MEDICATIONS: No current outpatient medications on file. (Ophthalmic Drugs)   No current facility-administered medications for this visit.  (Ophthalmic Drugs)   Current Outpatient Medications (Other)  Medication Sig  . amoxicillin-clavulanate (AUGMENTIN) 875-125 MG tablet TK 1 T PO  BID WITH FOOD FOR INFECTION  . aspirin EC 81 MG tablet 1 po q day  . buPROPion (WELLBUTRIN SR) 150 MG 12 hr tablet TK 1 T PO QD  . DOCOSAHEXAENOIC ACID PO Take by mouth.  . doxazosin (CARDURA) 4 MG tablet   . losartan (COZAAR) 100 MG tablet TK 1 T PO QD  . meloxicam (MOBIC) 15 MG tablet TK 1 T PO QD  . Multiple Vitamins-Minerals (ICAPS AREDS 2 PO) Take by mouth.  . sildenafil (REVATIO) 20 MG tablet Take by mouth.   Current Facility-Administered Medications (Other)  Medication Route  . Bevacizumab (AVASTIN) SOLN 1.25 mg  Intravitreal  . Bevacizumab (AVASTIN) SOLN 1.25 mg Intravitreal  . Bevacizumab (AVASTIN) SOLN 1.25 mg Intravitreal  . Bevacizumab (AVASTIN) SOLN 1.25 mg Intravitreal      REVIEW OF SYSTEMS: ROS    Positive for: Eyes   Negative for: Constitutional, Gastrointestinal, Neurological, Skin, Genitourinary, Musculoskeletal, HENT, Endocrine, Cardiovascular, Respiratory, Psychiatric, Allergic/Imm, Heme/Lymph   Last edited by Eldridge Scot, LPN on 01/06/5408  9:52 AM. (History)       ALLERGIES Allergies  Allergen Reactions  . Sulfa Antibiotics Hives  . Trazodone And Nefazodone Swelling    PAST MEDICAL HISTORY Past Medical History:  Diagnosis Date  . Diabetes [facility])    Past Surgical History:  Procedure Laterality Date  . APPENDECTOMY      FAMILY HISTORY Family History  Problem Relation Age of Onset  . Cataracts Mother   . Fuch's dystrophy Mother   . Glaucoma Mother   . Diabetes Brother     SOCIAL HISTORY Social History   Tobacco Use  . Smoking status: Former Games developer  . Smokeless tobacco: Never Used  Substance Use Topics  . Alcohol use: Yes    Alcohol/week: 1.0 standard drinks    Types: 1 Cans of beer per week  . Drug use: Never         OPHTHALMIC EXAM:  Base Eye Exam    Visual Acuity (Snellen -  Linear)      Right Left   Dist cc 20/30 20/40   Dist ph cc 20/25 20/30   Correction:  Glasses       Tonometry (Tonopen, 9:53 AM)      Right Left   Pressure 12 12       Pupils      Dark Light Shape React APD   Right 4 3 Round Brisk None   Left 4 3 Round Brisk None       Visual Fields (Counting fingers)      Left Right    Full Full       Extraocular Movement      Right Left    Full, Ortho Full, Ortho       Neuro/Psych    Oriented x3:  Yes   Mood/Affect:  Normal       Dilation    Both eyes:  1.0% Mydriacyl, 2.5% Phenylephrine @ 9:54 AM        Slit Lamp and Fundus Exam    Slit Lamp Exam      Right Left   Lids/Lashes Dermatochalasis - upper  lid Dermatochalasis - upper lid   Conjunctiva/Sclera White and quiet White and quiet   Cornea Arcus, otherwise clear Arcus, otherwise clear   Anterior Chamber Deep and quiet Deep and quiet   Iris Round and dilated Round and dilated   Lens 2+ Nuclear sclerosis, 2-3+ Cortical cataract 2+ Nuclear sclerosis, 2-3+ Cortical cataract   Vitreous Vitreous syneresis, Posterior vitreous detachment Vitreous syneresis       Fundus Exam      Right Left   Disc Compact, mild Temporal Peripapillary atrophy Pink and Sharp, mild Temporal Peripapillary atrophy   C/D Ratio 0.1 0.1   Macula Blunted foveal reflex, central PED with heme resolved, RPE mottling and clumping, Drusen Blunted foveal reflex, Epiretinal membrane, RPE mottling /  Clumping / atophy, Drusen, No heme or edema   Vessels Vascular attenuation Vascular attenuation   Periphery Attached, scattered RPE changes Attached, scattered peripheral drusen           IMAGING AND PROCEDURES  Imaging and Procedures for @TODAY @  OCT, Retina - OU - Both Eyes       Right Eye Quality was good. Central Foveal Thickness: 333. Progression has improved. Findings include abnormal foveal contour, no IRF, pigment epithelial detachment, retinal drusen , no SRF, outer retinal atrophy.   Left Eye Quality was good. Central Foveal Thickness: 274. Progression has been stable. Findings include normal foveal contour, epiretinal membrane, retinal drusen , pigment epithelial detachment, outer retinal atrophy, no SRF, no IRF, subretinal hyper-reflective material.   Notes *Images captured and stored on drive  Diagnosis / Impression:  OD: Exudative ARMD - stable resolution of SRF and interval shrinking of PED OS: Non- Exudative ARMD with early atrophy; ERM  Clinical management:  See below  Abbreviations: NFP - Normal foveal profile. CME - cystoid macular edema. PED - pigment epithelial detachment. IRF - intraretinal fluid. SRF - subretinal fluid. EZ - ellipsoid  zone. ERM - epiretinal membrane. ORA - outer retinal atrophy. ORT - outer retinal tubulation. SRHM - subretinal hyper-reflective material         Intravitreal Injection, Pharmacologic Agent - OD - Right Eye       Time Out 03/16/2018. 11:31 AM. Confirmed correct patient, procedure, site, and patient consented.   Anesthesia Topical anesthesia was used. Anesthetic medications included Lidocaine 2%, Tetracaine 0.5%.   Procedure Preparation included 5% betadine to ocular  surface, eyelid speculum. A supplied needle was used.   Injection:  1.25 mg Bevacizumab 1.25mg /0.4705ml   NDC: 91478-295-6250242-060-01, Lot: [id number] , Expiration date: 04/07/2018   Route: Intravitreal, Site: Right Eye, Waste: 0 mL  Post-op Post injection exam found visual acuity of at least counting fingers. The patient tolerated the procedure well. There were no complications. The patient received written and verbal post procedure care education.                 ASSESSMENT/PLAN:    ICD-10-CM   1. Exudative age-related macular degeneration of right eye with active choroidal neovascularization (HCC) H35.3211 OCT, Retina - OU - Both Eyes    Intravitreal Injection, Pharmacologic Agent - OD - Right Eye    Bevacizumab (AVASTIN) SOLN 1.25 mg  2. Retinal edema H35.81 OCT, Retina - OU - Both Eyes  3. Intermediate stage nonexudative age-related macular degeneration of left eye H35.3122   4. Posterior vitreous detachment of right eye H43.811   5. 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 Exudative age related macular degeneration, OD  - at initial presentation, pt endorsed metamorphopsia   - OCT OD with PED and overlying SRF + drusen  - S/P IVA OD #1 (05.08.19) -- excellent early response!, #2 (06.06.19) #3 (07.03.19)  - today, pt reports stable resolution of metamorphopsia   - OCT shows persistent resolution of SRF and interval mild collapse of PED  - discussed findings and treatment options:  treat and extend vs PRN  - recommend IVA #4 OD with extension to 8 weeks  - pt wishes to be treated with IVA #4 OD today (08.13.19)  - RBA of procedure discussed, questions answered  - informed consent obtained and signed  - see procedure note  - pt wishes to do treat and extend  - f/u in 8 weeks -- if fluid remains resolved extend to 10-12 wks  3. Age related macular degeneration, non-exudative, OS  - The incidence, anatomy, and pathology of dry AMD, risk of progression, and the AREDS and AREDS 2 study including smoking risks discussed with patient.  - continue amsler grid monitoring  4. PVD / vitreous syneresis  Discussed findings and prognosis  No RT or RD on 360 peripheral exam  Reviewed s/s of RT/RD  Strict return precautions for any such RT/RD signs/symptoms  5. Combined form age-related cataract OU-  - The symptoms of cataract, surgical options, and treatments and risks were discussed with patient. - discussed diagnosis and progression - not yet visually significant - monitor for now   Ophthalmic Meds Ordered this visit:  Meds ordered this encounter  Medications  . Bevacizumab (AVASTIN) SOLN 1.25 mg       Return in about 8 weeks (around 05/11/2018) for F/U Exu AMD OD, DFE OCT.  There are no Patient Instructions on file for this visit.   Explained the diagnoses, plan, and follow up with the patient and they expressed understanding.  Patient expressed understanding of the importance of proper follow up care.   This document serves as a record of services personally performed by Karie ChimeraBrian G. Ryelle Ruvalcaba, MD, PhD. It was created on their behalf by Laurian BrimAmanda Brown, OA, an ophthalmic assistant. The creation of this record is the provider's dictation and/or activities during the visit.    Electronically signed by: Laurian BrimAmanda Brown, OA  08.12.2019 10:52 PM     Karie ChimeraBrian G. Mirel Hundal, M.D., Ph.D. Diseases & Surgery of the Retina and Vitreous [facility]  I have  reviewed the above documentation for accuracy and completeness, and I agree with the above. Karie ChimeraBrian G. Clary Meeker, M.D., Ph.D. 03/16/18 10:55 PM    Abbreviations: M myopia (nearsighted); A astigmatism; H hyperopia (farsighted); P presbyopia; Mrx spectacle prescription;  CTL contact lenses; OD right eye; OS left eye; OU both eyes  XT exotropia; ET esotropia; PEK punctate epithelial keratitis; PEE punctate epithelial erosions; DES dry eye syndrome; MGD meibomian gland dysfunction; ATs artificial tears; PFAT's preservative free artificial tears; NSC nuclear sclerotic cataract; PSC posterior subcapsular cataract; ERM epi-retinal membrane; PVD posterior vitreous detachment; RD retinal detachment; DM diabetes mellitus; DR diabetic retinopathy; NPDR non-proliferative diabetic retinopathy; PDR proliferative diabetic retinopathy; CSME clinically significant macular edema; DME diabetic macular edema; dbh dot blot hemorrhages; CWS cotton wool spot; POAG primary open angle glaucoma; C/D cup-to-disc ratio; HVF humphrey visual field; GVF goldmann visual field; OCT optical coherence tomography; IOP intraocular pressure; BRVO Branch retinal vein occlusion; CRVO central retinal vein occlusion; CRAO central retinal artery occlusion; BRAO branch retinal artery occlusion; RT retinal tear; SB scleral buckle; PPV pars plana vitrectomy; VH Vitreous hemorrhage; PRP panretinal laser photocoagulation; IVK intravitreal kenalog; VMT vitreomacular traction; MH Macular hole;  NVD neovascularization of the disc; NVE neovascularization elsewhere; AREDS age related eye disease study; ARMD age related macular degeneration; POAG primary open angle glaucoma; EBMD epithelial/anterior basement membrane dystrophy; ACIOL anterior chamber intraocular lens; IOL intraocular lens; PCIOL posterior chamber intraocular lens; Phaco/IOL phacoemulsification with intraocular lens placement; PRK photorefractive keratectomy; LASIK laser assisted in situ keratomileusis;  HTN hypertension; DM diabetes mellitus; COPD chronic obstructive pulmonary disease

## 2018-03-16 ENCOUNTER — Encounter (INDEPENDENT_AMBULATORY_CARE_PROVIDER_SITE_OTHER): Payer: Self-pay | Admitting: Ophthalmology

## 2018-03-16 ENCOUNTER — Ambulatory Visit (INDEPENDENT_AMBULATORY_CARE_PROVIDER_SITE_OTHER): Payer: Medicare Other | Admitting: Ophthalmology

## 2018-03-16 DIAGNOSIS — H353211 Exudative age-related macular degeneration, right eye, with active choroidal neovascularization: Secondary | ICD-10-CM | POA: Diagnosis not present

## 2018-03-16 DIAGNOSIS — H353122 Nonexudative age-related macular degeneration, left eye, intermediate dry stage: Secondary | ICD-10-CM | POA: Diagnosis not present

## 2018-03-16 DIAGNOSIS — H3581 Retinal edema: Secondary | ICD-10-CM

## 2018-03-16 DIAGNOSIS — H43811 Vitreous degeneration, right eye: Secondary | ICD-10-CM

## 2018-03-16 DIAGNOSIS — H25813 Combined forms of age-related cataract, bilateral: Secondary | ICD-10-CM

## 2018-03-16 MED ORDER — BEVACIZUMAB CHEMO INJECTION 1.25MG/0.05ML SYRINGE FOR KALEIDOSCOPE
1.2500 mg | INTRAVITREAL | Status: DC
  Administered 2018-03-16: 1.25 mg via INTRAVITREAL

## 2018-03-17 ENCOUNTER — Encounter (INDEPENDENT_AMBULATORY_CARE_PROVIDER_SITE_OTHER): Payer: Medicare Other | Admitting: Ophthalmology

## 2018-05-10 NOTE — Progress Notes (Signed)
[facility] - Clinic Note  05/11/2018     CHIEF COMPLAINT Patient presents for Retina Follow Up   HISTORY OF PRESENT ILLNESS: Timothy Pruitt is a 72 y.o. male who presents to the clinic today for:   HPI    Retina Follow Up    Patient presents with  Wet AMD.  In right eye.  This started 4 months ago.  Severity is mild.  Since onset it is stable.  I, the attending physician,  performed the HPI with the patient and updated documentation appropriately.          Comments    F/U EXU AMD OD. Patient states vision "has not improved, but has not gotten any worse" Denies new visual onsets. Pt ready for tx today.       Last edited by Rennis Chris, MD on 05/11/2018 11:19 AM. (History)      Referring physician: Theodoro Kos, MD [address] [place], [place] [address]  HISTORICAL INFORMATION:   Selected notes from the MEDICAL RECORD NUMBER Referred by Dr. [name] for concern of exudative ARMD OD LEE: 05.07.19 (K. Giusto) [BCVA: OD: 20/40 OS: 20/30-2] Ocular Hx-Exudative ARMD OD, Non-exudative ARMD OS, Cataract OU PMH-DM, HTN, Former Smoker    CURRENT MEDICATIONS: No current outpatient medications on file. (Ophthalmic Drugs)   No current facility-administered medications for this visit.  (Ophthalmic Drugs)   Current Outpatient Medications (Other)  Medication Sig  . amoxicillin-clavulanate (AUGMENTIN) 875-125 MG tablet TK 1 T PO  BID WITH FOOD FOR INFECTION  . aspirin EC 81 MG tablet 1 po q day  . buPROPion (WELLBUTRIN SR) 150 MG 12 hr tablet TK 1 T PO QD  . DOCOSAHEXAENOIC ACID PO Take by mouth.  . doxazosin (CARDURA) 4 MG tablet   . losartan (COZAAR) 100 MG tablet TK 1 T PO QD  . meloxicam (MOBIC) 15 MG tablet TK 1 T PO QD  . Multiple Vitamins-Minerals (ICAPS AREDS 2 PO) Take by mouth.  . sildenafil (REVATIO) 20 MG tablet Take by mouth.   Current Facility-Administered Medications (Other)  Medication Route  . Bevacizumab (AVASTIN) SOLN 1.25 mg  Intravitreal  . Bevacizumab (AVASTIN) SOLN 1.25 mg Intravitreal  . Bevacizumab (AVASTIN) SOLN 1.25 mg Intravitreal  . Bevacizumab (AVASTIN) SOLN 1.25 mg Intravitreal  . Bevacizumab (AVASTIN) SOLN 1.25 mg Intravitreal      REVIEW OF SYSTEMS: ROS    Positive for: Eyes   Negative for: Constitutional, Gastrointestinal, Neurological, Skin, Genitourinary, Musculoskeletal, HENT, Endocrine, Cardiovascular, Respiratory, Psychiatric, Allergic/Imm, Heme/Lymph   Last edited by Eldridge Scot, LPN on 60/11/5407 10:09 AM. (History)       ALLERGIES Allergies  Allergen Reactions  . Sulfa Antibiotics Hives  . Trazodone And Nefazodone Swelling    PAST MEDICAL HISTORY Past Medical History:  Diagnosis Date  . Diabetes [facility])    Past Surgical History:  Procedure Laterality Date  . APPENDECTOMY      FAMILY HISTORY Family History  Problem Relation Age of Onset  . Cataracts Mother   . Fuch's dystrophy Mother   . Glaucoma Mother   . Diabetes Brother     SOCIAL HISTORY Social History   Tobacco Use  . Smoking status: Former Games developer  . Smokeless tobacco: Never Used  Substance Use Topics  . Alcohol use: Yes    Alcohol/week: 1.0 standard drinks    Types: 1 Cans of beer per week  . Drug use: Never         OPHTHALMIC EXAM:  Base  Eye Exam    Visual Acuity (Snellen - Linear)      Right Left   Dist cc 20/30 +2 20/40   Dist ph cc NI NI       Tonometry (Tonopen, 10:04 AM)      Right Left   Pressure 15 15       Pupils      Dark Light Shape React APD   Right 4 3 Round Brisk None   Left 4 3 Round Brisk None       Visual Fields (Counting fingers)      Left Right    Full Full       Extraocular Movement      Right Left    Full, Ortho Full, Ortho       Neuro/Psych    Oriented x3:  Yes   Mood/Affect:  Normal       Dilation    Both eyes:  1.0% Mydriacyl, 2.5% Phenylephrine @ 10:01 AM        Slit Lamp and Fundus Exam    Slit Lamp Exam      Right Left    Lids/Lashes Dermatochalasis - upper lid Dermatochalasis - upper lid   Conjunctiva/Sclera White and quiet White and quiet   Cornea Arcus, otherwise clear Arcus, otherwise clear   Anterior Chamber Deep and quiet Deep and quiet   Iris Round and dilated Round and dilated   Lens 2+ Nuclear sclerosis, 2-3+ Cortical cataract 2+ Nuclear sclerosis, 2-3+ Cortical cataract   Vitreous Vitreous syneresis, Posterior vitreous detachment Vitreous syneresis       Fundus Exam      Right Left   Disc Compact, mild Temporal Peripapillary atrophy Pink and Sharp, mild Temporal Peripapillary atrophy   C/D Ratio 0.1 0.1   Macula Blunted foveal reflex, central PED with heme resolved, RPE mottling and clumping, Drusen Blunted foveal reflex, Epiretinal membrane, RPE mottling /  Clumping / atophy, Drusen, No heme or edema   Vessels Vascular attenuation Vascular attenuation   Periphery Attached, scattered RPE changes Attached, scattered peripheral drusen           IMAGING AND PROCEDURES  Imaging and Procedures for @TODAY @  OCT, Retina - OU - Both Eyes       Right Eye Quality was good. Central Foveal Thickness: 329. Progression has been stable. Findings include abnormal foveal contour, no IRF, pigment epithelial detachment, retinal drusen , no SRF, outer retinal atrophy (Stable, no worsening).   Left Eye Quality was good. Central Foveal Thickness: 267. Progression has been stable. Findings include normal foveal contour, epiretinal membrane, retinal drusen , pigment epithelial detachment, outer retinal atrophy, no SRF, no IRF, subretinal hyper-reflective material.   Notes *Images captured and stored on drive  Diagnosis / Impression:  OD: Exudative ARMD - stable resolution of SRF and stable PED OS: Non- Exudative ARMD with early atrophy; ERM  Clinical management:  See below  Abbreviations: NFP - Normal foveal profile. CME - cystoid macular edema. PED - pigment epithelial detachment. IRF - intraretinal  fluid. SRF - subretinal fluid. EZ - ellipsoid zone. ERM - epiretinal membrane. ORA - outer retinal atrophy. ORT - outer retinal tubulation. SRHM - subretinal hyper-reflective material         Intravitreal Injection, Pharmacologic Agent - OD - Right Eye       Time Out 05/11/2018. 11:59 AM. Confirmed correct patient, procedure, site, and patient consented.   Anesthesia Topical anesthesia was used. Anesthetic medications included Lidocaine 2%, Proparacaine 0.5%.  Procedure Preparation included 5% betadine to ocular surface, eyelid speculum. A supplied needle was used.   Injection:  1.25 mg Bevacizumab 1.25mg /0.31ml   NDC: 81191-478-29, Lot: [id number], Expiration date: 07/15/2018   Route: Intravitreal, Site: Right Eye, Waste: 0 mg  Post-op Post injection exam found visual acuity of at least counting fingers. The patient tolerated the procedure well. There were no complications. The patient received written and verbal post procedure care education.                 ASSESSMENT/PLAN:    ICD-10-CM   1. Exudative age-related macular degeneration of right eye with active choroidal neovascularization (HCC) H35.3211 OCT, Retina - OU - Both Eyes    Intravitreal Injection, Pharmacologic Agent - OD - Right Eye    Bevacizumab (AVASTIN) SOLN 1.25 mg  2. Retinal edema H35.81 OCT, Retina - OU - Both Eyes  3. Intermediate stage nonexudative age-related macular degeneration of left eye H35.3122   4. Posterior vitreous detachment of right eye H43.811   5. 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 Exudative age related macular degeneration, OD  - at initial presentation, pt endorsed metamorphopsia   - OCT OD with PED and overlying SRF + drusen  - S/P IVA OD #1 (05.08.19) early resolution, #2 (06.06.19) #3 (07.03.19), #4 (08.13.19)  - today, pt reports stable resolution of metamorphopsia   - OCT shows persistent resolution of SRF and interval mild collapse of  PED  - discussed findings and treatment options: treat and extend vs PRN  - recommend IVA #5 OD with extension to 10-12 weeks  - pt wishes to be treated with IVA #5 OD today (10.08.19)  - RBA of procedure discussed, questions answered  - informed consent obtained and signed  - see procedure note  - pt wishes to continue treat and extend  - f/u in 10 weeks -- if stable will extend to 12  3. Age related macular degeneration, non-exudative, OS  - The incidence, anatomy, and pathology of dry AMD, risk of progression, and the AREDS and AREDS 2 study including smoking risks discussed with patient.  - continue amsler grid monitoring  4. PVD / vitreous syneresis  Discussed findings and prognosis  No RT or RD on 360 peripheral exam  Reviewed s/s of RT/RD  Strict return precautions for any such RT/RD signs/symptoms  5. Combined form age-related cataract OU-  - The symptoms of cataract, surgical options, and treatments and risks were discussed with patient. - discussed diagnosis and progression - not yet visually significant - monitor for now   Ophthalmic Meds Ordered this visit:  Meds ordered this encounter  Medications  . Bevacizumab (AVASTIN) SOLN 1.25 mg       Return in about 10 weeks (around 07/20/2018) for F/U Exu AMD OD DFE, OCT.  There are no Patient Instructions on file for this visit.   Explained the diagnoses, plan, and follow up with the patient and they expressed understanding.  Patient expressed understanding of the importance of proper follow up care.   This document serves as a record of services personally performed by Karie Chimera, MD, PhD. It was created on their behalf by Laurian Brim, OA, an ophthalmic assistant. The creation of this record is the provider's dictation and/or activities during the visit.    Electronically signed by: Laurian Brim, OA  10.07.19 11:35 AM    This document serves as a record of services personally performed by Isaias Cowman.  Vanessa Barbara,  MD, PhD. It was created on their behalf by Virgilio Belling, COA, a certified ophthalmic assistant. The creation of this record is the provider's dictation and/or activities during the visit.  Electronically signed by: Virgilio Belling, COA  10.08.19 11:35 AM    Karie Chimera, M.D., Ph.D. Diseases & Surgery of the Retina and Vitreous [facility]   I have reviewed the above documentation for accuracy and completeness, and I agree with the above. Karie Chimera, M.D., Ph.D. 05/16/18 11:37 AM   Abbreviations: M myopia (nearsighted); A astigmatism; H hyperopia (farsighted); P presbyopia; Mrx spectacle prescription;  CTL contact lenses; OD right eye; OS left eye; OU both eyes  XT exotropia; ET esotropia; PEK punctate epithelial keratitis; PEE punctate epithelial erosions; DES dry eye syndrome; MGD meibomian gland dysfunction; ATs artificial tears; PFAT's preservative free artificial tears; NSC nuclear sclerotic cataract; PSC posterior subcapsular cataract; ERM epi-retinal membrane; PVD posterior vitreous detachment; RD retinal detachment; DM diabetes mellitus; DR diabetic retinopathy; NPDR non-proliferative diabetic retinopathy; PDR proliferative diabetic retinopathy; CSME clinically significant macular edema; DME diabetic macular edema; dbh dot blot hemorrhages; CWS cotton wool spot; POAG primary open angle glaucoma; C/D cup-to-disc ratio; HVF humphrey visual field; GVF goldmann visual field; OCT optical coherence tomography; IOP intraocular pressure; BRVO Branch retinal vein occlusion; CRVO central retinal vein occlusion; CRAO central retinal artery occlusion; BRAO branch retinal artery occlusion; RT retinal tear; SB scleral buckle; PPV pars plana vitrectomy; VH Vitreous hemorrhage; PRP panretinal laser photocoagulation; IVK intravitreal kenalog; VMT vitreomacular traction; MH Macular hole;  NVD neovascularization of the disc; NVE neovascularization elsewhere; AREDS age related  eye disease study; ARMD age related macular degeneration; POAG primary open angle glaucoma; EBMD epithelial/anterior basement membrane dystrophy; ACIOL anterior chamber intraocular lens; IOL intraocular lens; PCIOL posterior chamber intraocular lens; Phaco/IOL phacoemulsification with intraocular lens placement; PRK photorefractive keratectomy; LASIK laser assisted in situ keratomileusis; HTN hypertension; DM diabetes mellitus; COPD chronic obstructive pulmonary disease

## 2018-05-11 ENCOUNTER — Ambulatory Visit (INDEPENDENT_AMBULATORY_CARE_PROVIDER_SITE_OTHER): Payer: Medicare Other | Admitting: Ophthalmology

## 2018-05-11 DIAGNOSIS — H3581 Retinal edema: Secondary | ICD-10-CM

## 2018-05-11 DIAGNOSIS — H43811 Vitreous degeneration, right eye: Secondary | ICD-10-CM | POA: Diagnosis not present

## 2018-05-11 DIAGNOSIS — H353211 Exudative age-related macular degeneration, right eye, with active choroidal neovascularization: Secondary | ICD-10-CM | POA: Diagnosis not present

## 2018-05-11 DIAGNOSIS — H353122 Nonexudative age-related macular degeneration, left eye, intermediate dry stage: Secondary | ICD-10-CM | POA: Diagnosis not present

## 2018-05-11 DIAGNOSIS — H25813 Combined forms of age-related cataract, bilateral: Secondary | ICD-10-CM

## 2018-05-11 MED ORDER — BEVACIZUMAB CHEMO INJECTION 1.25MG/0.05ML SYRINGE FOR KALEIDOSCOPE
1.2500 mg | INTRAVITREAL | Status: DC
  Administered 2018-05-11: 1.25 mg via INTRAVITREAL

## 2018-05-16 ENCOUNTER — Encounter (INDEPENDENT_AMBULATORY_CARE_PROVIDER_SITE_OTHER): Payer: Self-pay | Admitting: Ophthalmology

## 2018-07-16 NOTE — Progress Notes (Signed)
[facility] - Clinic Note  07/19/2018     CHIEF COMPLAINT Patient presents for Retina Follow Up   HISTORY OF PRESENT ILLNESS: Timothy Pruitt is a 72 y.o. male who presents to the clinic today for:   HPI    Retina Follow Up    Patient presents with  Wet AMD.  In right eye.  Severity is moderate.  Duration of 10 weeks.  Since onset it is stable.  I, the attending physician,  performed the HPI with the patient and updated documentation appropriately.          Comments    Patient states vision the same OU.No eye pain/discomfort.       Last edited by Rennis Chris, MD on 07/19/2018 12:37 PM. (History)    pt states he has not noticed any change in his vision  Referring physician: [name], OD [address] [place], [place] [address]  HISTORICAL INFORMATION:   Selected notes from the MEDICAL RECORD NUMBER Referred by Dr. [name] for concern of exudative ARMD OD LEE: 05.07.19 (K. Giusto) [BCVA: OD: 20/40 OS: 20/30-2] Ocular Hx-Exudative ARMD OD, Non-exudative ARMD OS, Cataract OU PMH-DM, HTN, Former Smoker    CURRENT MEDICATIONS: No current outpatient medications on file. (Ophthalmic Drugs)   No current facility-administered medications for this visit.  (Ophthalmic Drugs)   Current Outpatient Medications (Other)  Medication Sig  . aspirin EC 81 MG tablet 1 po q day  . buPROPion (WELLBUTRIN SR) 150 MG 12 hr tablet TK 1 T PO QD  . buPROPion (WELLBUTRIN SR) 150 MG 12 hr tablet Take 150 mg by mouth once.  . DOCOSAHEXAENOIC ACID PO Take by mouth.  . doxazosin (CARDURA) 4 MG tablet   . doxazosin (CARDURA) 4 MG tablet Take 4 mg by mouth daily.  Marland Kitchen losartan (COZAAR) 100 MG tablet TK 1 T PO QD  . losartan (COZAAR) 100 MG tablet Take 100 mg by mouth daily.  . meloxicam (MOBIC) 15 MG tablet TK 1 T PO QD  . Multiple Vitamins-Minerals (ICAPS AREDS 2 PO) Take by mouth.  . sildenafil (REVATIO) 20 MG tablet Take by mouth.  Marland Kitchen amoxicillin-clavulanate  (AUGMENTIN) 875-125 MG tablet TK 1 T PO  BID WITH FOOD FOR INFECTION   Current Facility-Administered Medications (Other)  Medication Route  . Bevacizumab (AVASTIN) SOLN 1.25 mg Intravitreal  . Bevacizumab (AVASTIN) SOLN 1.25 mg Intravitreal  . Bevacizumab (AVASTIN) SOLN 1.25 mg Intravitreal  . Bevacizumab (AVASTIN) SOLN 1.25 mg Intravitreal  . Bevacizumab (AVASTIN) SOLN 1.25 mg Intravitreal  . Bevacizumab (AVASTIN) SOLN 1.25 mg Intravitreal      REVIEW OF SYSTEMS: ROS    Positive for: Eyes   Negative for: Constitutional, Gastrointestinal, Neurological, Skin, Genitourinary, Musculoskeletal, HENT, Endocrine, Cardiovascular, Respiratory, Psychiatric, Allergic/Imm, Heme/Lymph   Last edited by Annalee Genta D on 07/19/2018 10:08 AM. (History)       ALLERGIES Allergies  Allergen Reactions  . Sulfa Antibiotics Hives  . Trazodone And Nefazodone Swelling    PAST MEDICAL HISTORY Past Medical History:  Diagnosis Date  . Diabetes [facility])    Past Surgical History:  Procedure Laterality Date  . APPENDECTOMY      FAMILY HISTORY Family History  Problem Relation Age of Onset  . Cataracts Mother   . Fuch's dystrophy Mother   . Glaucoma Mother   . Diabetes Brother     SOCIAL HISTORY Social History   Tobacco Use  . Smoking status: Former Games developer  . Smokeless tobacco: Never Used  Substance  Use Topics  . Alcohol use: Yes    Alcohol/week: 1.0 standard drinks    Types: 1 Cans of beer per week  . Drug use: Never         OPHTHALMIC EXAM:  Base Eye Exam    Visual Acuity (Snellen - Linear)      Right Left   Dist cc 20/30 +1 20/40   Dist ph cc 20/25 +1 NI   Correction:  Glasses       Tonometry (Tonopen, 10:16 AM)      Right Left   Pressure 16 17       Pupils      Dark Light Shape React APD   Right 4 3 Round Brisk None   Left 4 3 Round Brisk None       Visual Fields (Counting fingers)      Left Right    Full Full       Extraocular Movement      Right Left     Full, Ortho Full, Ortho       Neuro/Psych    Oriented x3:  Yes   Mood/Affect:  Normal       Dilation    Both eyes:  1.0% Mydriacyl, 2.5% Phenylephrine @ 10:16 AM        Slit Lamp and Fundus Exam    Slit Lamp Exam      Right Left   Lids/Lashes Dermatochalasis - upper lid Dermatochalasis - upper lid   Conjunctiva/Sclera White and quiet White and quiet   Cornea Arcus, otherwise clear Arcus, otherwise clear   Anterior Chamber Deep and quiet Deep and quiet   Iris Round and dilated Round and dilated   Lens 2+ Nuclear sclerosis, 2-3+ Cortical cataract 2+ Nuclear sclerosis, 2-3+ Cortical cataract   Vitreous Vitreous syneresis, Posterior vitreous detachment Vitreous syneresis       Fundus Exam      Right Left   Disc Compact, mild Temporal Peripapillary atrophy Pink and Sharp, mild Temporal Peripapillary atrophy   C/D Ratio 0.1 0.1   Macula Blunted foveal reflex, central PED with heme resolved, RPE mottling and clumping, Drusen Blunted foveal reflex, Epiretinal membrane, RPE mottling /  Clumping / atophy, Drusen, No heme or edema   Vessels Vascular attenuation Vascular attenuation   Periphery Attached, scattered RPE changes Attached, scattered peripheral drusen         Refraction    Wearing Rx      Sphere Cylinder Axis   Right -3.25 +0.50 016   Left -2.75 Sphere    Type:  SVL          IMAGING AND PROCEDURES  Imaging and Procedures for @TODAY @  OCT, Retina - OU - Both Eyes       Right Eye Quality was good. Central Foveal Thickness: 331. Progression has been stable. Findings include abnormal foveal contour, no IRF, pigment epithelial detachment, retinal drusen , no SRF, outer retinal atrophy (Mild interval collapse of PED).   Left Eye Quality was good. Central Foveal Thickness: 270. Progression has been stable. Findings include normal foveal contour, epiretinal membrane, retinal drusen , pigment epithelial detachment, outer retinal atrophy, no SRF, no IRF, subretinal  hyper-reflective material.   Notes *Images captured and stored on drive  Diagnosis / Impression:  OD: Exudative ARMD - stable resolution of SRF and stable PED OS: Non- Exudative ARMD with early atrophy; ERM  Clinical management:  See below  Abbreviations: NFP - Normal foveal profile. CME - cystoid macular edema. PED -  pigment epithelial detachment. IRF - intraretinal fluid. SRF - subretinal fluid. EZ - ellipsoid zone. ERM - epiretinal membrane. ORA - outer retinal atrophy. ORT - outer retinal tubulation. SRHM - subretinal hyper-reflective material         Intravitreal Injection, Pharmacologic Agent - OD - Right Eye       Time Out 07/19/2018. 11:47 AM. Confirmed correct patient, procedure, site, and patient consented.   Anesthesia Topical anesthesia was used. Anesthetic medications included Lidocaine 2%, Proparacaine 0.5%.   Procedure Preparation included 5% betadine to ocular surface, eyelid speculum. A 30 gauge needle was used.   Injection:  1.25 mg Bevacizumab (AVASTIN) SOLN   NDC: 96045-409-81, Lot: [id number] , Expiration date: 08/28/2018   Route: Intravitreal, Site: Right Eye, Waste: 0 mL  Post-op Post injection exam found visual acuity of at least counting fingers. The patient tolerated the procedure well. There were no complications. The patient received written and verbal post procedure care education.                 ASSESSMENT/PLAN:    ICD-10-CM   1. 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 Retinal edema H35.81 OCT, Retina - OU - Both Eyes  3. Intermediate stage nonexudative age-related macular degeneration of left eye H35.3122   4. Posterior vitreous detachment of right eye H43.811   5. 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 Exudative age related  macular degeneration, OD  - at initial presentation, pt endorsed metamorphopsia   - OCT OD with PED and overlying SRF + drusen  - S/P IVA OD #1 (05.08.19) early resolution, #2 (06.06.19) #3 (07.03.19), #4 (08.13.19), #5 (10.08.19)  - today, pt reports stable resolution of metamorphopsia   - OCT shows persistent resolution of SRF and interval mild collapse of PED  - pt doing great with treat and extend to 10 wks this last time  - recommend IVA #6 OD with extension 12 weeks  - pt wishes to be treated with IVA #6 OD today (12.13.19)  - RBA of procedure discussed, questions answered  - informed consent obtained and signed  - see procedure note  - pt wishes to continue treat and extend  - f/u in 12 weeks   3. Age related macular degeneration, non-exudative, OS  - The incidence, anatomy, and pathology of dry AMD, risk of progression, and the AREDS and AREDS 2 study including smoking risks discussed with patient.  - continue amsler grid monitoring  4. PVD / vitreous syneresis  Discussed findings and prognosis  No RT or RD on 360 peripheral exam  Reviewed s/s of RT/RD  Strict return precautions for any such RT/RD signs/symptoms  5. Combined form age-related cataract OU-  - The symptoms of cataract, surgical options, and treatments and risks were discussed with patient. - discussed diagnosis and progression - not yet visually significant - monitor for now   Ophthalmic Meds Ordered this visit:  Meds ordered this encounter  Medications  . Bevacizumab (AVASTIN) SOLN 1.25 mg       Return in about 12 weeks (around 10/11/2018) for F/U exu ARMD OD, DFE, OCT.  There are no Patient Instructions on file for this visit.   Explained the diagnoses, plan, and follow up with the patient and they expressed understanding.  Patient expressed understanding of the importance of proper follow  up care.   This document serves as a record of services personally performed by Karie ChimeraBrian G. Lynesha Bango, MD, PhD. It  was created on their behalf by Laurian BrimAmanda Brown, OA, an ophthalmic assistant. The creation of this record is the provider's dictation and/or activities during the visit.    Electronically signed by: Laurian BrimAmanda Brown, OA  12.13.19 4:45 PM     Karie ChimeraBrian G. Tavon Magnussen, M.D., Ph.D. Diseases & Surgery of the Retina and Vitreous [facility]  I have reviewed the above documentation for accuracy and completeness, and I agree with the above. Karie ChimeraBrian G. Marque Bango, M.D., Ph.D. 07/20/18 4:46 PM     Abbreviations: M myopia (nearsighted); A astigmatism; H hyperopia (farsighted); P presbyopia; Mrx spectacle prescription;  CTL contact lenses; OD right eye; OS left eye; OU both eyes  XT exotropia; ET esotropia; PEK punctate epithelial keratitis; PEE punctate epithelial erosions; DES dry eye syndrome; MGD meibomian gland dysfunction; ATs artificial tears; PFAT's preservative free artificial tears; NSC nuclear sclerotic cataract; PSC posterior subcapsular cataract; ERM epi-retinal membrane; PVD posterior vitreous detachment; RD retinal detachment; DM diabetes mellitus; DR diabetic retinopathy; NPDR non-proliferative diabetic retinopathy; PDR proliferative diabetic retinopathy; CSME clinically significant macular edema; DME diabetic macular edema; dbh dot blot hemorrhages; CWS cotton wool spot; POAG primary open angle glaucoma; C/D cup-to-disc ratio; HVF humphrey visual field; GVF goldmann visual field; OCT optical coherence tomography; IOP intraocular pressure; BRVO Branch retinal vein occlusion; CRVO central retinal vein occlusion; CRAO central retinal artery occlusion; BRAO branch retinal artery occlusion; RT retinal tear; SB scleral buckle; PPV pars plana vitrectomy; VH Vitreous hemorrhage; PRP panretinal laser photocoagulation; IVK intravitreal kenalog; VMT vitreomacular traction; MH Macular hole;  NVD neovascularization of the disc; NVE neovascularization elsewhere; AREDS age related eye disease study; ARMD age  related macular degeneration; POAG primary open angle glaucoma; EBMD epithelial/anterior basement membrane dystrophy; ACIOL anterior chamber intraocular lens; IOL intraocular lens; PCIOL posterior chamber intraocular lens; Phaco/IOL phacoemulsification with intraocular lens placement; PRK photorefractive keratectomy; LASIK laser assisted in situ keratomileusis; HTN hypertension; DM diabetes mellitus; COPD chronic obstructive pulmonary disease

## 2018-07-19 ENCOUNTER — Ambulatory Visit (INDEPENDENT_AMBULATORY_CARE_PROVIDER_SITE_OTHER): Payer: Medicare Other | Admitting: Ophthalmology

## 2018-07-19 ENCOUNTER — Encounter (INDEPENDENT_AMBULATORY_CARE_PROVIDER_SITE_OTHER): Payer: Self-pay | Admitting: Ophthalmology

## 2018-07-19 DIAGNOSIS — H43811 Vitreous degeneration, right eye: Secondary | ICD-10-CM | POA: Diagnosis not present

## 2018-07-19 DIAGNOSIS — H353122 Nonexudative age-related macular degeneration, left eye, intermediate dry stage: Secondary | ICD-10-CM

## 2018-07-19 DIAGNOSIS — H3581 Retinal edema: Secondary | ICD-10-CM

## 2018-07-19 DIAGNOSIS — H353211 Exudative age-related macular degeneration, right eye, with active choroidal neovascularization: Secondary | ICD-10-CM | POA: Diagnosis not present

## 2018-07-19 DIAGNOSIS — H25813 Combined forms of age-related cataract, bilateral: Secondary | ICD-10-CM

## 2018-07-19 MED ORDER — BEVACIZUMAB CHEMO INJECTION 1.25MG/0.05ML SYRINGE FOR KALEIDOSCOPE
1.2500 mg | INTRAVITREAL | Status: DC
  Administered 2018-07-19: 1.25 mg via INTRAVITREAL

## 2018-07-20 ENCOUNTER — Encounter (INDEPENDENT_AMBULATORY_CARE_PROVIDER_SITE_OTHER): Payer: Medicare Other | Admitting: Ophthalmology

## 2018-07-20 ENCOUNTER — Encounter (INDEPENDENT_AMBULATORY_CARE_PROVIDER_SITE_OTHER): Payer: Self-pay | Admitting: Ophthalmology

## 2018-10-12 NOTE — Progress Notes (Addendum)
[facility] - Clinic Note  10/14/2018     CHIEF COMPLAINT Patient presents for Retina Follow Up   HISTORY OF PRESENT ILLNESS: Timothy Pruitt is a 73 y.o. male who presents to the clinic today for:   HPI    Retina Follow Up    Patient presents with  Wet AMD.  In right eye.  Severity is mild.  Duration of 3 months.  Since onset it is stable.  I, the attending physician,  performed the HPI with the patient and updated documentation appropriately.          Comments    Pt here for 3 months f/u for exu ARMD, pt states his vision has not changed OU, he denies pain, flashes, floaters, pt is not using any gtts       Last edited by Rennis Chris, MD on 10/15/2018  4:52 PM. (History)    pt states he has not noticed any change in vision over the last 3 months  Referring physician: [name], OD [address] [place], [place] [address]  HISTORICAL INFORMATION:   Selected notes from the MEDICAL RECORD NUMBER Referred by Dr. [name] for concern of exudative ARMD OD LEE: 05.07.19 (K. Giusto) [BCVA: OD: 20/40 OS: 20/30-2] Ocular Hx-Exudative ARMD OD, Non-exudative ARMD OS, Cataract OU PMH-DM, HTN, Former Smoker    CURRENT MEDICATIONS: No current outpatient medications on file. (Ophthalmic Drugs)   No current facility-administered medications for this visit.  (Ophthalmic Drugs)   Current Outpatient Medications (Other)  Medication Sig  . amoxicillin-clavulanate (AUGMENTIN) 875-125 MG tablet TK 1 T PO  BID WITH FOOD FOR INFECTION  . aspirin EC 81 MG tablet 1 po q day  . buPROPion (WELLBUTRIN SR) 150 MG 12 hr tablet TK 1 T PO QD  . buPROPion (WELLBUTRIN SR) 150 MG 12 hr tablet Take 150 mg by mouth once.  . DOCOSAHEXAENOIC ACID PO Take by mouth.  . doxazosin (CARDURA) 4 MG tablet   . doxazosin (CARDURA) 4 MG tablet Take 4 mg by mouth daily.  Marland Kitchen losartan (COZAAR) 100 MG tablet TK 1 T PO QD  . losartan (COZAAR) 100 MG tablet Take 100 mg by mouth daily.  .  meloxicam (MOBIC) 15 MG tablet TK 1 T PO QD  . Multiple Vitamins-Minerals (ICAPS AREDS 2 PO) Take by mouth.  . sildenafil (REVATIO) 20 MG tablet Take by mouth.   Current Facility-Administered Medications (Other)  Medication Route  . Bevacizumab (AVASTIN) SOLN 1.25 mg Intravitreal  . Bevacizumab (AVASTIN) SOLN 1.25 mg Intravitreal  . Bevacizumab (AVASTIN) SOLN 1.25 mg Intravitreal  . Bevacizumab (AVASTIN) SOLN 1.25 mg Intravitreal  . Bevacizumab (AVASTIN) SOLN 1.25 mg Intravitreal  . Bevacizumab (AVASTIN) SOLN 1.25 mg Intravitreal  . Bevacizumab (AVASTIN) SOLN 1.25 mg Intravitreal      REVIEW OF SYSTEMS: ROS    Positive for: Eyes   Negative for: Constitutional, Gastrointestinal, Neurological, Skin, Genitourinary, Musculoskeletal, HENT, Endocrine, Cardiovascular, Respiratory, Psychiatric, Allergic/Imm, Heme/Lymph   Last edited by Posey Boyer, COT on 10/14/2018 11:11 AM. (History)       ALLERGIES Allergies  Allergen Reactions  . Sulfa Antibiotics Hives  . Trazodone And Nefazodone Swelling    PAST MEDICAL HISTORY Past Medical History:  Diagnosis Date  . Diabetes [facility])    Past Surgical History:  Procedure Laterality Date  . APPENDECTOMY      FAMILY HISTORY Family History  Problem Relation Age of Onset  . Cataracts Mother   . Fuch's dystrophy Mother   .  Glaucoma Mother   . Diabetes Brother     SOCIAL HISTORY Social History   Tobacco Use  . Smoking status: Former Games developer  . Smokeless tobacco: Never Used  Substance Use Topics  . Alcohol use: Yes    Alcohol/week: 1.0 standard drinks    Types: 1 Cans of beer per week  . Drug use: Never         OPHTHALMIC EXAM:  Base Eye Exam    Visual Acuity (Snellen - Linear)      Right Left   Dist cc 20/30 +2 20/40 +1   Dist ph cc 20/20 -2 NI   Correction:  Glasses       Tonometry (Tonopen, 9:36 AM)      Right Left   Pressure 13 15       Pupils      APD   Right None   Left None       Visual Fields  (Counting fingers)      Left Right    Full Full       Extraocular Movement      Right Left    Full, Ortho Full, Ortho       Neuro/Psych    Oriented x3:  Yes   Mood/Affect:  Normal       Dilation    Both eyes:  1.0% Mydriacyl, 2.5% Phenylephrine @ 9:36 AM        Slit Lamp and Fundus Exam    Slit Lamp Exam      Right Left   Lids/Lashes Dermatochalasis - upper lid Dermatochalasis - upper lid   Conjunctiva/Sclera White and quiet White and quiet   Cornea Arcus, otherwise clear Arcus, otherwise clear   Anterior Chamber Deep and quiet Deep and quiet   Iris Round and dilated Round and dilated   Lens 2+ Nuclear sclerosis, 2-3+ Cortical cataract 2+ Nuclear sclerosis, 2-3+ Cortical cataract   Vitreous Vitreous syneresis, Posterior vitreous detachment Vitreous syneresis       Fundus Exam      Right Left   Disc Compact, mild Temporal Peripapillary atrophy, Pink and Sharp Pink and Sharp, mild Temporal Peripapillary atrophy   C/D Ratio 0.1 0.1   Macula Blunted foveal reflex, central PED with heme resolved, RPE mottling and clumping, Drusen Blunted foveal reflex, Epiretinal membrane, RPE mottling /  Clumping / atophy, Drusen, No heme or edema   Vessels Vascular attenuation Vascular attenuation   Periphery Attached, scattered RPE changes Attached, scattered peripheral drusen           IMAGING AND PROCEDURES  Imaging and Procedures for @  OCT, Retina - OU - Both Eyes       Right Eye Quality was good. Central Foveal Thickness: 330. Progression has been stable. Findings include abnormal foveal contour, no IRF, pigment epithelial detachment, retinal drusen , no SRF, outer retinal atrophy (Mild interval collapse of PED).   Left Eye Quality was good. Central Foveal Thickness: 301. Progression has been stable. Findings include normal foveal contour, epiretinal membrane, retinal drusen , pigment epithelial detachment, outer retinal atrophy, no SRF, no IRF, subretinal  hyper-reflective material.   Notes *Images captured and stored on drive  Diagnosis / Impression:  OD: Exudative ARMD - stable resolution of SRF and stable PED -- stable from prior OS: Non- Exudative ARMD with early atrophy; ERM -- stable from prior  Clinical management:  See below  Abbreviations: NFP - Normal foveal profile. CME - cystoid macular edema. PED - pigment epithelial detachment. IRF -  intraretinal fluid. SRF - subretinal fluid. EZ - ellipsoid zone. ERM - epiretinal membrane. ORA - outer retinal atrophy. ORT - outer retinal tubulation. SRHM - subretinal hyper-reflective material         Intravitreal Injection, Pharmacologic Agent - OD - Right Eye       Time Out 10/14/2018. 11:24 AM. Confirmed correct patient, procedure, site, and patient consented.   Anesthesia Topical anesthesia was used. Anesthetic medications included Lidocaine 4%, Proparacaine 0.5%.   Procedure Preparation included 5% betadine to ocular surface, eyelid speculum. A supplied needle was used.   Injection:  1.25 mg Bevacizumab (AVASTIN) SOLN   NDC: 70360-001-02, Lot: [id number] , Expiration date: 12/01/2018   Route: Intravitreal, Site: Right Eye, Waste: 0 mg  Post-op Post injection exam found visual acuity of at least counting fingers. The patient tolerated the procedure well. There were no complications. The patient received written and verbal post procedure care education. Post injection medications were not given.                 ASSESSMENT/PLAN:    ICD-10-CM   1. 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 Retinal edema H35.81 OCT, Retina - OU - Both Eyes  3. Intermediate stage nonexudative age-related macular degeneration of left eye H35.3122   4. Posterior vitreous detachment of right eye H43.811   5. 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 Exudative age related macular degeneration, OD  - at initial presentation, pt endorsed metamorphopsia   - OCT OD with PED and overlying SRF + drusen  - S/P IVA OD #1 (05.08.19) early resolution, #2 (06.06.19) #3 (07.03.19), #4 (08.13.19), #5 (10.08.19), #6 (12.13.19)  - today, pt reports stable resolution of metamorphopsia   - OCT shows persistent resolution of SRF and stable improvement PED  - pt doing great with treat and extend to 12 wks this last time  - BCVA 20/20 OD  - discussed treatment options of treat and extend vs prn -- pt wishes to try to treat and extend to 16 wks  - recommend IVA #7 OD with extension to 16 weeks  - pt wishes to be treated with IVA #7 OD today (03.12.20)  - RBA of procedure discussed, questions answered  - informed consent obtained and signed  - see procedure note  - f/u in 16 weeks, sooner prn -- DFE/OCT   3. Age related macular degeneration, non-exudative, OS  - The incidence, anatomy, and pathology of dry AMD, risk of progression, and the AREDS and AREDS 2 study including smoking risks discussed with patient.  - continue amsler grid monitoring  4. PVD / vitreous syneresis  Discussed findings and prognosis  No RT or RD on 360 peripheral exam  Reviewed s/s of RT/RD  Strict return precautions for any such RT/RD signs/symptoms  5. Combined form age-related cataract OU-  - The symptoms of cataract, surgical options, and treatments and risks were discussed with patient. - discussed diagnosis and progression - not yet visually significant - monitor for now   Ophthalmic Meds Ordered this visit:  Meds ordered this encounter  Medications  . Bevacizumab (AVASTIN) SOLN 1.25 mg       Return in about 16 weeks (around 02/03/2019) for f/u exu ARMD OD, DFE, OCT.  There are no Patient Instructions on file for this visit.  Explained the diagnoses, plan, and follow up with the patient and they  expressed understanding.  Patient expressed understanding of the importance of proper follow up care.   This document serves as a record of services personally performed by Karie Chimera, MD, PhD. It was created on their behalf by Laurian Brim, OA, an ophthalmic assistant. The creation of this record is the provider's dictation and/or activities during the visit.    Electronically signed by: Laurian Brim, OA  03.10.2020 11:45 AM    Karie Chimera, M.D., Ph.D. Diseases & Surgery of the Retina and Vitreous [facility]   I have reviewed the above documentation for accuracy and completeness, and I agree with the above. Karie Chimera, M.D., Ph.D. 10/17/18 11:45 AM    Abbreviations: M myopia (nearsighted); A astigmatism; H hyperopia (farsighted); P presbyopia; Mrx spectacle prescription;  CTL contact lenses; OD right eye; OS left eye; OU both eyes  XT exotropia; ET esotropia; PEK punctate epithelial keratitis; PEE punctate epithelial erosions; DES dry eye syndrome; MGD meibomian gland dysfunction; ATs artificial tears; PFAT's preservative free artificial tears; NSC nuclear sclerotic cataract; PSC posterior subcapsular cataract; ERM epi-retinal membrane; PVD posterior vitreous detachment; RD retinal detachment; DM diabetes mellitus; DR diabetic retinopathy; NPDR non-proliferative diabetic retinopathy; PDR proliferative diabetic retinopathy; CSME clinically significant macular edema; DME diabetic macular edema; dbh dot blot hemorrhages; CWS cotton wool spot; POAG primary open angle glaucoma; C/D cup-to-disc ratio; HVF humphrey visual field; GVF goldmann visual field; OCT optical coherence tomography; IOP intraocular pressure; BRVO Branch retinal vein occlusion; CRVO central retinal vein occlusion; CRAO central retinal artery occlusion; BRAO branch retinal artery occlusion; RT retinal tear; SB scleral buckle; PPV pars plana vitrectomy; VH Vitreous hemorrhage; PRP panretinal laser  photocoagulation; IVK intravitreal kenalog; VMT vitreomacular traction; MH Macular hole;  NVD neovascularization of the disc; NVE neovascularization elsewhere; AREDS age related eye disease study; ARMD age related macular degeneration; POAG primary open angle glaucoma; EBMD epithelial/anterior basement membrane dystrophy; ACIOL anterior chamber intraocular lens; IOL intraocular lens; PCIOL posterior chamber intraocular lens; Phaco/IOL phacoemulsification with intraocular lens placement; PRK photorefractive keratectomy; LASIK laser assisted in situ keratomileusis; HTN hypertension; DM diabetes mellitus; COPD chronic obstructive pulmonary disease

## 2018-10-14 ENCOUNTER — Ambulatory Visit (INDEPENDENT_AMBULATORY_CARE_PROVIDER_SITE_OTHER): Payer: Medicare Other | Admitting: Ophthalmology

## 2018-10-14 ENCOUNTER — Other Ambulatory Visit: Payer: Self-pay

## 2018-10-14 DIAGNOSIS — H3581 Retinal edema: Secondary | ICD-10-CM

## 2018-10-14 DIAGNOSIS — H25813 Combined forms of age-related cataract, bilateral: Secondary | ICD-10-CM

## 2018-10-14 DIAGNOSIS — H43811 Vitreous degeneration, right eye: Secondary | ICD-10-CM

## 2018-10-14 DIAGNOSIS — H353211 Exudative age-related macular degeneration, right eye, with active choroidal neovascularization: Secondary | ICD-10-CM

## 2018-10-14 DIAGNOSIS — H353122 Nonexudative age-related macular degeneration, left eye, intermediate dry stage: Secondary | ICD-10-CM | POA: Diagnosis not present

## 2018-10-15 ENCOUNTER — Encounter (INDEPENDENT_AMBULATORY_CARE_PROVIDER_SITE_OTHER): Payer: Self-pay | Admitting: Ophthalmology

## 2018-10-15 MED ORDER — BEVACIZUMAB CHEMO INJECTION 1.25MG/0.05ML SYRINGE FOR KALEIDOSCOPE
1.2500 mg | INTRAVITREAL | Status: DC
  Administered 2018-10-15: 1.25 mg via INTRAVITREAL

## 2019-02-02 NOTE — Progress Notes (Signed)
[facility] - Clinic Note  02/03/2019     CHIEF COMPLAINT Patient presents for Retina Follow Up   HISTORY OF PRESENT ILLNESS: Timothy Pruitt is a 73 y.o. male who presents to the clinic today for:   HPI    Retina Follow Up    Patient presents with  Wet AMD.  In right eye.  Severity is moderate.  Duration of 16 weeks.  Since onset it is stable.  I, the attending physician,  performed the HPI with the patient and updated documentation appropriately.          Comments    Patient states vision the same OU.       Last edited by Rennis ChrisZamora, Alexsandra Shontz, MD on 02/03/2019 10:14 AM. (History)    pt states he has not noticed any change in vision over the last 16 weeks.  Referring physician: Theodoro KosLewis, William B, MD [address] [place],  [place] [id number]  HISTORICAL INFORMATION:   Selected notes from the MEDICAL RECORD NUMBER Referred by Dr. Jonny RuizKenneth Giusto for concern of exudative ARMD OD LEE: 05.07.19 (K. Giusto) [[facility]: OD: 20/40 OS: 20/30-2] Ocular Hx-Exudative ARMD OD, Non-exudative ARMD OS, Cataract OU PMH-DM, HTN, Former Smoker    CURRENT MEDICATIONS: No current outpatient medications on file. (Ophthalmic Drugs)   No current facility-administered medications for this visit.  (Ophthalmic Drugs)   Current Outpatient Medications (Other)  Medication Sig  . amoxicillin-clavulanate (AUGMENTIN) 875-125 MG tablet TK 1 T PO  BID WITH FOOD FOR INFECTION  . aspirin EC 81 MG tablet 1 po q day  . buPROPion (WELLBUTRIN SR) 150 MG 12 hr tablet TK 1 T PO QD  . buPROPion (WELLBUTRIN SR) 150 MG 12 hr tablet Take 150 mg by mouth once.  . DOCOSAHEXAENOIC ACID PO Take by mouth.  . doxazosin (CARDURA) 4 MG tablet   . doxazosin (CARDURA) 4 MG tablet Take 4 mg by mouth daily.  Marland Kitchen. losartan (COZAAR) 100 MG tablet TK 1 T PO QD  . meloxicam (MOBIC) 15 MG tablet TK 1 T PO QD  . Multiple Vitamins-Minerals (ICAPS AREDS 2 PO) Take by mouth.  . sildenafil (REVATIO) 20 MG tablet Take by mouth.   . losartan (COZAAR) 100 MG tablet Take 100 mg by mouth daily.   Current Facility-Administered Medications (Other)  Medication Route  . Bevacizumab (AVASTIN) SOLN 1.25 mg Intravitreal  . Bevacizumab (AVASTIN) SOLN 1.25 mg Intravitreal  . Bevacizumab (AVASTIN) SOLN 1.25 mg Intravitreal  . Bevacizumab (AVASTIN) SOLN 1.25 mg Intravitreal  . Bevacizumab (AVASTIN) SOLN 1.25 mg Intravitreal  . Bevacizumab (AVASTIN) SOLN 1.25 mg Intravitreal  . Bevacizumab (AVASTIN) SOLN 1.25 mg Intravitreal      REVIEW OF SYSTEMS: ROS    Positive for: Eyes   Negative for: Constitutional, Gastrointestinal, Neurological, Skin, Genitourinary, Musculoskeletal, HENT, Endocrine, Cardiovascular, Respiratory, Psychiatric, Allergic/Imm, Heme/Lymph   Last edited by Annalee GentaBarber, Daryl D on 02/03/2019  9:04 AM. (History)       ALLERGIES Allergies  Allergen Reactions  . Sulfa Antibiotics Hives  . Trazodone And Nefazodone Swelling    PAST MEDICAL HISTORY Past Medical History:  Diagnosis Date  . Diabetes [facility](HCC)    Past Surgical History:  Procedure Laterality Date  . APPENDECTOMY      FAMILY HISTORY Family History  Problem Relation Age of Onset  . Cataracts Mother   . Fuch's dystrophy Mother   . Glaucoma Mother   . Diabetes Brother     SOCIAL HISTORY Social History   Tobacco Use  .  Smoking status: Former Games developermoker  . Smokeless tobacco: Never Used  Substance Use Topics  . Alcohol use: Yes    Alcohol/week: 1.0 standard drinks    Types: 1 Cans of beer per week  . Drug use: Never         OPHTHALMIC EXAM:  Base Eye Exam    Visual Acuity (Snellen - Linear)      Right Left   Dist cc 20/30 +2 20/50   Dist ph cc NI 20/40 -1   Correction: Glasses       Tonometry (Tonopen, 9:13 AM)      Right Left   Pressure 18 15       Pupils      Dark Light Shape React APD   Right 4 3 Round Brisk None   Left 4 3 Round Brisk None       Visual Fields (Counting fingers)      Left Right    Full Full        Extraocular Movement      Right Left    Full, Ortho Full, Ortho       Neuro/Psych    Oriented x3: Yes   Mood/Affect: Normal       Dilation    Both eyes: 1.0% Mydriacyl, 2.5% Phenylephrine @ 9:13 AM        Slit Lamp and Fundus Exam    Slit Lamp Exam      Right Left   Lids/Lashes Dermatochalasis - upper lid Dermatochalasis - upper lid   Conjunctiva/Sclera White and quiet White and quiet   Cornea Arcus, otherwise clear Arcus, otherwise clear   Anterior Chamber Deep and quiet Deep and quiet   Iris Round and dilated Round and dilated   Lens 2-3+ Nuclear sclerosis, 2-3+ Cortical cataract 2+ Nuclear sclerosis, 3+ Cortical cataract   Vitreous Vitreous syneresis, Posterior vitreous detachment Vitreous syneresis       Fundus Exam      Right Left   Disc Compact, mild Temporal Peripapillary atrophy, Pink and Sharp Pink and Sharp, mild Temporal Peripapillary atrophy   C/D Ratio 0.1 0.1   Macula Blunted foveal reflex, central PED with heme resolved, RPE mottling and clumping, Drusen Blunted foveal reflex, Epiretinal membrane, RPE mottling /  Clumping / atophy, Drusen, No heme or edema   Vessels Vascular attenuation, mild a/v crossing changes, mild tortuosity  Vascular attenuation   Periphery Attached, scattered RPE changes, no heme Attached, scattered peripheral drusen         Refraction    Wearing Rx      Sphere Cylinder Axis   Right -3.25 +0.50 016   Left -2.75 Sphere    Type: SVL       Manifest Refraction      Sphere Cylinder Axis Dist VA   Right -2.75 +0.75 180 20/25+2   Left -2.50 +0.50 180 20/40+1          IMAGING AND PROCEDURES  Imaging and Procedures for @TODAY @  OCT, Retina - OU - Both Eyes       Right Eye Quality was good. Central Foveal Thickness: 328. Progression has been stable. Findings include abnormal foveal contour, no IRF, pigment epithelial detachment, retinal drusen , no SRF, outer retinal atrophy (Stable to slightly improved PED).   Left  Eye Quality was good. Progression has been stable. Findings include normal foveal contour, epiretinal membrane, retinal drusen , pigment epithelial detachment, outer retinal atrophy, no SRF, no IRF, subretinal hyper-reflective material (stable).   Notes *Images  captured and stored on drive  Diagnosis / Impression:  OD: Exudative ARMD - stable to improved PED OS: Non- Exudative ARMD with early atrophy; ERM -- stable from prior  Clinical management:  See below  Abbreviations: NFP - Normal foveal profile. CME - cystoid macular edema. PED - pigment epithelial detachment. IRF - intraretinal fluid. SRF - subretinal fluid. EZ - ellipsoid zone. ERM - epiretinal membrane. ORA - outer retinal atrophy. ORT - outer retinal tubulation. SRHM - subretinal hyper-reflective material         Intravitreal Injection, Pharmacologic Agent - OD - Right Eye       Time Out 02/03/2019. 10:40 AM. Confirmed correct patient, procedure, site, and patient consented.   Anesthesia Anesthetic medications included Proparacaine 0.5%.   Procedure Preparation included 5% betadine to ocular surface, eyelid speculum. A supplied needle was used.   Injection:  1.25 mg Bevacizumab (AVASTIN) SOLN   NDC: 62130-865-7850242-060-01, Lot: [id number] , Expiration date: 03/16/2019   Route: Intravitreal, Site: Right Eye, Waste: 0 mL  Post-op Post injection exam found visual acuity of at least counting fingers. The patient tolerated the procedure well. There were no complications. The patient received written and verbal post procedure care education.                 ASSESSMENT/PLAN:    ICD-10-CM   1. Exudative age-related macular degeneration of right eye with active choroidal neovascularization (HCC)  H35.3211 Intravitreal Injection, Pharmacologic Agent - OD - Right Eye    Bevacizumab (AVASTIN) SOLN 1.25 mg  2. Retinal edema  H35.81 OCT, Retina - OU - Both Eyes  3. Intermediate stage nonexudative age-related macular degeneration  of left eye  H35.3122   4. Posterior vitreous detachment of right eye  H43.811   5. Combined forms of age-related cataract of both eyes  H25.813     1, 2. Exudative age related macular degeneration, OD  - at initial presentation, pt endorsed metamorphopsia   - OCT OD with PED and overlying SRF + drusen  - S/P IVA OD #1 (05.08.19) early resolution, #2 (06.06.19) #3 (07.03.19), #4 (08.13.19), #5 (10.08.19), #6 (12.13.19), #7 (03.12.20)  - today, pt reports stable resolution of metamorphopsia   - OCT shows persistent resolution of SRF and stable PED at 16 wk interval  - BCVA decreased to 20/25 (down from 20/20) -- likely mild cataract progression  - discussed treatment options  - pt wishes to be treated with IVA #8 OD today (07.02.20)--maintenance w/ f/u in 16 wks again  - RBA of procedure discussed, questions answered  - informed consent obtained and signed  - see procedure note  - f/u in 16 weeks, sooner prn -- DFE/OCT   3. Age related macular degeneration, non-exudative, OS  - The incidence, anatomy, and pathology of dry AMD, risk of progression, and the AREDS and AREDS 2 study including smoking risks discussed with patient.  - continue amsler grid monitoring  4. PVD / vitreous syneresis  Discussed findings and prognosis  No RT or RD on 360 peripheral exam  Reviewed s/s of RT/RD  Strict return precautions for any such RT/RD signs/symptoms  5. Combined form age-related cataract OU-  - The symptoms of cataract, surgical options, and treatments and risks were discussed with patient. - discussed diagnosis and progression - may be approaching visual significance - monitor for now   Ophthalmic Meds Ordered this visit:  Meds ordered this encounter  Medications  . Bevacizumab (AVASTIN) SOLN 1.25 mg       Return 16  weeks, for DFE, OCT.  There are no Patient Instructions on file for this visit.   Explained the diagnoses, plan, and follow up with the patient and they expressed  understanding.  Patient expressed understanding of the importance of proper follow up care.   This document serves as a record of services personally performed by Gardiner Sleeper, MD, PhD. It was created on their behalf by Ernest Mallick, OA, an ophthalmic assistant. The creation of this record is the provider's dictation and/or activities during the visit.    Electronically signed by: Ernest Mallick, OA  07.01.2020 10:28 PM    Gardiner Sleeper, M.D., Ph.D. Diseases & Surgery of the Retina and Vitreous [facility]  I have reviewed the above documentation for accuracy and completeness, and I agree with the above. Gardiner Sleeper, M.D., Ph.D. 02/03/19 10:28 PM    Abbreviations: M myopia (nearsighted); A astigmatism; H hyperopia (farsighted); P presbyopia; Mrx spectacle prescription;  CTL contact lenses; OD right eye; OS left eye; OU both eyes  XT exotropia; ET esotropia; PEK punctate epithelial keratitis; PEE punctate epithelial erosions; DES dry eye syndrome; MGD meibomian gland dysfunction; ATs artificial tears; PFAT's preservative free artificial tears; [place] nuclear sclerotic cataract; PSC posterior subcapsular cataract; ERM epi-retinal membrane; PVD posterior vitreous detachment; RD retinal detachment; DM diabetes mellitus; DR diabetic retinopathy; NPDR non-proliferative diabetic retinopathy; PDR proliferative diabetic retinopathy; CSME clinically significant macular edema; DME diabetic macular edema; dbh dot blot hemorrhages; CWS cotton wool spot; POAG primary open angle glaucoma; C/D cup-to-disc ratio; HVF humphrey visual field; GVF goldmann visual field; OCT optical coherence tomography; IOP intraocular pressure; BRVO Branch retinal vein occlusion; CRVO central retinal vein occlusion; CRAO central retinal artery occlusion; BRAO branch retinal artery occlusion; RT retinal tear; SB scleral buckle; PPV pars plana vitrectomy; VH Vitreous hemorrhage; PRP panretinal laser  photocoagulation; IVK intravitreal kenalog; VMT vitreomacular traction; MH Macular hole;  NVD neovascularization of the disc; NVE neovascularization elsewhere; AREDS age related eye disease study; ARMD age related macular degeneration; POAG primary open angle glaucoma; EBMD epithelial/anterior basement membrane dystrophy; ACIOL anterior chamber intraocular lens; IOL intraocular lens; PCIOL posterior chamber intraocular lens; Phaco/IOL phacoemulsification with intraocular lens placement; [place] photorefractive keratectomy; LASIK laser assisted in situ keratomileusis; HTN hypertension; DM diabetes mellitus; COPD chronic obstructive pulmonary disease

## 2019-02-03 ENCOUNTER — Encounter (INDEPENDENT_AMBULATORY_CARE_PROVIDER_SITE_OTHER): Payer: Self-pay | Admitting: Ophthalmology

## 2019-02-03 ENCOUNTER — Ambulatory Visit (INDEPENDENT_AMBULATORY_CARE_PROVIDER_SITE_OTHER): Payer: Medicare Other | Admitting: Ophthalmology

## 2019-02-03 ENCOUNTER — Other Ambulatory Visit: Payer: Self-pay

## 2019-02-03 DIAGNOSIS — H353211 Exudative age-related macular degeneration, right eye, with active choroidal neovascularization: Secondary | ICD-10-CM

## 2019-02-03 DIAGNOSIS — H353122 Nonexudative age-related macular degeneration, left eye, intermediate dry stage: Secondary | ICD-10-CM

## 2019-02-03 DIAGNOSIS — H43811 Vitreous degeneration, right eye: Secondary | ICD-10-CM

## 2019-02-03 DIAGNOSIS — H3581 Retinal edema: Secondary | ICD-10-CM | POA: Diagnosis not present

## 2019-02-03 DIAGNOSIS — H25813 Combined forms of age-related cataract, bilateral: Secondary | ICD-10-CM

## 2019-02-03 MED ORDER — BEVACIZUMAB CHEMO INJECTION 1.25MG/0.05ML SYRINGE FOR KALEIDOSCOPE
1.2500 mg | INTRAVITREAL | Status: AC | PRN
  Administered 2019-02-03: 22:00:00 1.25 mg via INTRAVITREAL

## 2019-05-24 NOTE — Progress Notes (Signed)
[facility] - Clinic Note  05/25/2019     CHIEF COMPLAINT Patient presents for Retina Follow Up   HISTORY OF PRESENT ILLNESS: Timothy Pruitt is a 73 y.o. male who presents to the clinic today for:   HPI    Retina Follow Up    Patient presents with  Wet AMD.  In right eye.  Severity is moderate.  Duration of 16 weeks.  Since onset it is stable.  I, the attending physician,  performed the HPI with the patient and updated documentation appropriately.          Comments    Patient states vision the same OU. On AREDS vitamins 2 tablets daily.        Last edited by Rennis Chris, MD on 05/25/2019  1:17 PM. (History)    pt states   Referring physician: Theodoro Kos, MD [id number] [facility] [address] [place],  [place] [address]  HISTORICAL INFORMATION:   Selected notes from the MEDICAL RECORD NUMBER Referred by Dr. [name] for concern of exudative ARMD OD LEE: 05.07.19 (K. Giusto) [BCVA: OD: 20/40 OS: 20/30-2] Ocular Hx-Exudative ARMD OD, Non-exudative ARMD OS, Cataract OU PMH-DM, HTN, Former Smoker    CURRENT MEDICATIONS: No current outpatient medications on file. (Ophthalmic Drugs)   No current facility-administered medications for this visit.  (Ophthalmic Drugs)   Current Outpatient Medications (Other)  Medication Sig  . amoxicillin-clavulanate (AUGMENTIN) 875-125 MG tablet TK 1 T PO  BID WITH FOOD FOR INFECTION  . aspirin EC 81 MG tablet 1 po q day  . buPROPion (WELLBUTRIN SR) 150 MG 12 hr tablet TK 1 T PO QD  . buPROPion (WELLBUTRIN SR) 150 MG 12 hr tablet Take 150 mg by mouth once.  . DOCOSAHEXAENOIC ACID PO Take by mouth.  . doxazosin (CARDURA) 4 MG tablet   . losartan (COZAAR) 100 MG tablet TK 1 T PO QD  . meloxicam (MOBIC) 15 MG tablet TK 1 T PO QD  . Multiple Vitamins-Minerals (ICAPS AREDS 2 PO) Take by mouth.  . sildenafil (REVATIO) 20 MG tablet Take by mouth.  . tamsulosin (FLOMAX) 0.4 MG CAPS capsule Take 0.4 mg by mouth daily.  Marland Kitchen doxazosin  (CARDURA) 4 MG tablet Take 4 mg by mouth daily.  Marland Kitchen losartan (COZAAR) 100 MG tablet Take 100 mg by mouth daily.   Current Facility-Administered Medications (Other)  Medication Route  . Bevacizumab (AVASTIN) SOLN 1.25 mg Intravitreal  . Bevacizumab (AVASTIN) SOLN 1.25 mg Intravitreal  . Bevacizumab (AVASTIN) SOLN 1.25 mg Intravitreal  . Bevacizumab (AVASTIN) SOLN 1.25 mg Intravitreal  . Bevacizumab (AVASTIN) SOLN 1.25 mg Intravitreal  . Bevacizumab (AVASTIN) SOLN 1.25 mg Intravitreal  . Bevacizumab (AVASTIN) SOLN 1.25 mg Intravitreal      REVIEW OF SYSTEMS: ROS    Positive for: Eyes   Negative for: Constitutional, Gastrointestinal, Neurological, Skin, Genitourinary, Musculoskeletal, HENT, Endocrine, Cardiovascular, Respiratory, Psychiatric, Allergic/Imm, Heme/Lymph   Last edited by Annalee Genta D, COT on 05/25/2019  9:51 AM. (History)       ALLERGIES Allergies  Allergen Reactions  . Sulfa Antibiotics Hives  . Trazodone And Nefazodone Swelling    PAST MEDICAL HISTORY Past Medical History:  Diagnosis Date  . Diabetes [facility])    Past Surgical History:  Procedure Laterality Date  . APPENDECTOMY      FAMILY HISTORY Family History  Problem Relation Age of Onset  . Cataracts Mother   . Fuch's dystrophy Mother   . Glaucoma Mother   . Diabetes Brother  SOCIAL HISTORY Social History   Tobacco Use  . Smoking status: Former Games developermoker  . Smokeless tobacco: Never Used  Substance Use Topics  . Alcohol use: Yes    Alcohol/week: 1.0 standard drinks    Types: 1 Cans of beer per week  . Drug use: Never         OPHTHALMIC EXAM:  Base Eye Exam    Visual Acuity (Snellen - Linear)      Right Left   Dist cc 20/25 -2 20/50   Dist ph cc 20/25 20/30 -1       Tonometry (Tonopen, 10:01 AM)      Right Left   Pressure 14 16       Pupils      Dark Light Shape React APD   Right 4 3 Round Brisk None   Left 4 3 Round Brisk None       Visual Fields (Counting fingers)       Left Right    Full Full       Extraocular Movement      Right Left    Full, Ortho Full, Ortho       Neuro/Psych    Oriented x3: Yes   Mood/Affect: Normal       Dilation    Both eyes: 1.0% Mydriacyl, 2.5% Phenylephrine @ 10:01 AM        Slit Lamp and Fundus Exam    Slit Lamp Exam      Right Left   Lids/Lashes Dermatochalasis - upper lid Dermatochalasis - upper lid   Conjunctiva/Sclera White and quiet White and quiet   Cornea Arcus, otherwise clear Arcus, otherwise clear   Anterior Chamber Deep and quiet Deep and quiet   Iris Round and dilated Round and dilated   Lens 2-3+ Nuclear sclerosis, 2-3+ Cortical cataract 2+ Nuclear sclerosis, 3+ Cortical cataract   Vitreous Vitreous syneresis, Posterior vitreous detachment Vitreous syneresis       Fundus Exam      Right Left   Disc Compact, mild Temporal Peripapillary atrophy, Pink and Sharp Pink and Sharp, mild Temporal Peripapillary atrophy   C/D Ratio 0.1 0.1   Macula Blunted foveal reflex, central PED with heme resolved, RPE mottling and clumping, Drusen Blunted foveal reflex, Epiretinal membrane, RPE mottling /  Clumping / atophy, Drusen, No heme or edema, patch of RPE atrophy IT macula, stable   Vessels Vascular attenuation, mild a/v crossing changes, mild tortuosity  Vascular attenuation   Periphery Attached, scattered RPE changes, no heme Attached, scattered peripheral drusen         Refraction    Wearing Rx      Sphere Cylinder Axis   Right -3.25 +0.50 016   Left -2.75 Sphere    Type: SVL          IMAGING AND PROCEDURES  Imaging and Procedures for @TODAY @  OCT, Retina - OU - Both Eyes       Right Eye Quality was good. Central Foveal Thickness: 334. Progression has been stable. Findings include abnormal foveal contour, no IRF, pigment epithelial detachment, retinal drusen , no SRF, outer retinal atrophy (?interval decrease in PED height).   Left Eye Quality was good. Central Foveal Thickness: 269.  Progression has been stable. Findings include normal foveal contour, epiretinal membrane, retinal drusen , pigment epithelial detachment, outer retinal atrophy, no SRF, no IRF, subretinal hyper-reflective material (Stable patch of ORA and overlying atrophy IT macula).   Notes *Images captured and stored on drive  Diagnosis /  Impression:  OD: Exudative ARMD - no IRF/SRF; ?interval decrease in PED height OS: Non- Exudative ARMD with early atrophy; ERM -- Stable patch of ORA and overlying atrophy IT macula  Clinical management:  See below  Abbreviations: NFP - Normal foveal profile. CME - cystoid macular edema. PED - pigment epithelial detachment. IRF - intraretinal fluid. SRF - subretinal fluid. EZ - ellipsoid zone. ERM - epiretinal membrane. ORA - outer retinal atrophy. ORT - outer retinal tubulation. SRHM - subretinal hyper-reflective material         Intravitreal Injection, Pharmacologic Agent - OD - Right Eye       Time Out 05/25/2019. 10:45 AM. Confirmed correct patient, procedure, site, and patient consented.   Anesthesia Topical anesthesia was used. Anesthetic medications included Lidocaine 2%, Proparacaine 0.5%.   Procedure Preparation included 5% betadine to ocular surface, eyelid speculum. A 30 gauge needle was used.   Injection:  1.25 mg Bevacizumab (AVASTIN) SOLN   NDC: 31517-616-07, Lot: [id number] , Expiration date: 07/20/2019   Route: Intravitreal, Site: Right Eye, Waste: 0 mL  Post-op Post injection exam found visual acuity of at least counting fingers. The patient tolerated the procedure well. There were no complications. The patient received written and verbal post procedure care education.                 ASSESSMENT/PLAN:    ICD-10-CM   1. Exudative age-related macular degeneration of right eye with active choroidal neovascularization (HCC)  H35.3211 Intravitreal Injection, Pharmacologic Agent - OD - Right Eye    Bevacizumab (AVASTIN) SOLN 1.25 mg   2. Retinal edema  H35.81 OCT, Retina - OU - Both Eyes  3. Intermediate stage nonexudative age-related macular degeneration of left eye  H35.3122   4. Posterior vitreous detachment of right eye  H43.811   5. Combined forms of age-related cataract of both eyes  H25.813     1, 2. Exudative age related macular degeneration, OD  - at initial presentation, pt endorsed metamorphopsia   - OCT OD with PED and overlying SRF + drusen  - S/P IVA OD #1 (05.08.19) early resolution, #2 (06.06.19) #3 (07.03.19), #4 (08.13.19), #5 (10.08.19), #6 (12.13.19), #7 (03.12.20),#8 (07.02.20)  - today, pt reports stable resolution of metamorphopsia   - OCT shows persistent resolution of SRF and stable PED at 16 wk interval  - BCVA stable at 20/25   - discussed treatment options  - pt wishes to be treated with IVA #9 OD today (10.20.20)--maintenance w/ f/u in 16 wks again  - RBA of procedure discussed, questions answered  - informed consent obtained and signed  - see procedure note  - f/u in 16 weeks, sooner prn -- DFE/OCT   3. Age related macular degeneration, non-exudative, OS  - The incidence, anatomy, and pathology of dry AMD, risk of progression, and the AREDS and AREDS 2 study including smoking risks discussed with patient.  - continue amsler grid monitoring  4. PVD / vitreous syneresis  Discussed findings and prognosis  No RT or RD on 360 peripheral exam  Reviewed s/s of RT/RD  Strict return precautions for any such RT/RD signs/symptoms  5. Combined form age-related cataract OU-   - The symptoms of cataract, surgical options, and treatments and risks were discussed with patient.  - discussed diagnosis and progression  - may be approaching visual significance  - monitor for now   Ophthalmic Meds Ordered this visit:  Meds ordered this encounter  Medications  . Bevacizumab (AVASTIN) SOLN 1.25 mg  Return in about 16 weeks (around 09/14/2019) for f/u exu ARMD OD, DFE, OCT.  There are no  Patient Instructions on file for this visit.   Explained the diagnoses, plan, and follow up with the patient and they expressed understanding.  Patient expressed understanding of the importance of proper follow up care.   This document serves as a record of services personally performed by Gardiner Sleeper, MD, PhD. It was created on their behalf by Ernest Mallick, OA, an ophthalmic assistant. The creation of this record is the provider's dictation and/or activities during the visit.    Electronically signed by: Ernest Mallick, OA 10.20.2020 1:20 PM     Gardiner Sleeper, M.D., Ph.D. Diseases & Surgery of the Retina and Vitreous [facility]  I have reviewed the above documentation for accuracy and completeness, and I agree with the above. Gardiner Sleeper, M.D., Ph.D. 05/25/19 1:20 PM    Abbreviations: M myopia (nearsighted); A astigmatism; H hyperopia (farsighted); P presbyopia; Mrx spectacle prescription;  CTL contact lenses; OD right eye; OS left eye; OU both eyes  XT exotropia; ET esotropia; PEK punctate epithelial keratitis; PEE punctate epithelial erosions; DES dry eye syndrome; MGD meibomian gland dysfunction; ATs artificial tears; PFAT's preservative free artificial tears; [place] nuclear sclerotic cataract; PSC posterior subcapsular cataract; ERM epi-retinal membrane; PVD posterior vitreous detachment; RD retinal detachment; DM diabetes mellitus; DR diabetic retinopathy; NPDR non-proliferative diabetic retinopathy; PDR proliferative diabetic retinopathy; CSME clinically significant macular edema; DME diabetic macular edema; dbh dot blot hemorrhages; CWS cotton wool spot; POAG primary open angle glaucoma; C/D cup-to-disc ratio; HVF humphrey visual field; GVF goldmann visual field; OCT optical coherence tomography; IOP intraocular pressure; BRVO Branch retinal vein occlusion; CRVO central retinal vein occlusion; CRAO central retinal artery occlusion; BRAO branch retinal artery  occlusion; RT retinal tear; SB scleral buckle; PPV pars plana vitrectomy; VH Vitreous hemorrhage; PRP panretinal laser photocoagulation; IVK intravitreal kenalog; VMT vitreomacular traction; MH Macular hole;  NVD neovascularization of the disc; NVE neovascularization elsewhere; AREDS age related eye disease study; ARMD age related macular degeneration; POAG primary open angle glaucoma; EBMD epithelial/anterior basement membrane dystrophy; ACIOL anterior chamber intraocular lens; IOL intraocular lens; PCIOL posterior chamber intraocular lens; Phaco/IOL phacoemulsification with intraocular lens placement; [place] photorefractive keratectomy; LASIK laser assisted in situ keratomileusis; HTN hypertension; DM diabetes mellitus; COPD chronic obstructive pulmonary disease

## 2019-05-25 ENCOUNTER — Encounter (INDEPENDENT_AMBULATORY_CARE_PROVIDER_SITE_OTHER): Payer: Self-pay | Admitting: Ophthalmology

## 2019-05-25 ENCOUNTER — Ambulatory Visit (INDEPENDENT_AMBULATORY_CARE_PROVIDER_SITE_OTHER): Payer: Medicare Other | Admitting: Ophthalmology

## 2019-05-25 ENCOUNTER — Other Ambulatory Visit: Payer: Self-pay

## 2019-05-25 DIAGNOSIS — H353211 Exudative age-related macular degeneration, right eye, with active choroidal neovascularization: Secondary | ICD-10-CM | POA: Diagnosis not present

## 2019-05-25 DIAGNOSIS — H25813 Combined forms of age-related cataract, bilateral: Secondary | ICD-10-CM

## 2019-05-25 DIAGNOSIS — H353122 Nonexudative age-related macular degeneration, left eye, intermediate dry stage: Secondary | ICD-10-CM

## 2019-05-25 DIAGNOSIS — H43811 Vitreous degeneration, right eye: Secondary | ICD-10-CM | POA: Diagnosis not present

## 2019-05-25 DIAGNOSIS — H3581 Retinal edema: Secondary | ICD-10-CM

## 2019-05-25 MED ORDER — BEVACIZUMAB CHEMO INJECTION 1.25MG/0.05ML SYRINGE FOR KALEIDOSCOPE
1.2500 mg | INTRAVITREAL | Status: AC | PRN
  Administered 2019-05-25: 1.25 mg via INTRAVITREAL

## 2019-09-19 NOTE — Progress Notes (Signed)
[facility] - Clinic Note  09/20/2019     CHIEF COMPLAINT Patient presents for Retina Follow Up   HISTORY OF PRESENT ILLNESS: Timothy Pruitt is a 74 y.o. male who presents to the clinic today for:   HPI    Retina Follow Up    Patient presents with  Wet AMD.  In right eye.  This started months ago.  Severity is moderate.  Duration of 16 weeks.  Since onset it is stable.  I, the attending physician,  performed the HPI with the patient and updated documentation appropriately.          Comments    74 y/o male pt here for 16 wk f/u for exu ARMD OD.  No change in [place] OU.  Denies pain, flashes, floaters.  No gtts.       Last edited by Rennis Chris, MD on 09/20/2019 11:58 AM. (History)    pt states   Referring physician: Theodoro Kos, MD [id number] [place],  [place] [address]  HISTORICAL INFORMATION:   Selected notes from the MEDICAL RECORD NUMBER Referred by Dr. [name] for concern of exudative ARMD OD    CURRENT MEDICATIONS: No current outpatient medications on file. (Ophthalmic Drugs)   No current facility-administered medications for this visit. (Ophthalmic Drugs)   Current Outpatient Medications (Other)  Medication Sig  . amoxicillin-clavulanate (AUGMENTIN) 875-125 MG tablet TK 1 T PO  BID WITH FOOD FOR INFECTION  . aspirin EC 81 MG tablet 1 po q day  . buPROPion (WELLBUTRIN SR) 150 MG 12 hr tablet TK 1 T PO QD  . busPIRone (BUSPAR) 10 MG tablet   . DOCOSAHEXAENOIC ACID PO Take by mouth.  . doxazosin (CARDURA) 4 MG tablet Take 4 mg by mouth daily.  . Fingerstix Lancets MISC Inject into the skin.  Marland Kitchen glucose blood (ACCU-CHEK AVIVA PLUS) test strip 1 strip by external route every day  . losartan (COZAAR) 100 MG tablet TK 1 T PO QD  . meloxicam (MOBIC) 15 MG tablet TK 1 T PO QD  . Multiple Vitamins-Minerals (ICAPS AREDS 2 PO) Take by mouth.  . Omega-3 Fatty Acids (KP FISH OIL) 1200 MG CAPS Take by mouth.  . rosuvastatin (CRESTOR) 5 MG  tablet Take by mouth.  . sildenafil (REVATIO) 20 MG tablet Take by mouth.  . tamsulosin (FLOMAX) 0.4 MG CAPS capsule Take 0.4 mg by mouth daily.  Marland Kitchen buPROPion (WELLBUTRIN SR) 150 MG 12 hr tablet Take 150 mg by mouth once.  . doxazosin (CARDURA) 4 MG tablet   . losartan (COZAAR) 100 MG tablet Take 100 mg by mouth daily.   Current Facility-Administered Medications (Other)  Medication Route  . Bevacizumab (AVASTIN) SOLN 1.25 mg Intravitreal  . Bevacizumab (AVASTIN) SOLN 1.25 mg Intravitreal  . Bevacizumab (AVASTIN) SOLN 1.25 mg Intravitreal  . Bevacizumab (AVASTIN) SOLN 1.25 mg Intravitreal  . Bevacizumab (AVASTIN) SOLN 1.25 mg Intravitreal  . Bevacizumab (AVASTIN) SOLN 1.25 mg Intravitreal  . Bevacizumab (AVASTIN) SOLN 1.25 mg Intravitreal      REVIEW OF SYSTEMS: ROS    Positive for: Eyes   Negative for: Constitutional, Gastrointestinal, Neurological, Skin, Genitourinary, Musculoskeletal, HENT, Endocrine, Cardiovascular, Respiratory, Psychiatric, Allergic/Imm, Heme/Lymph   Last edited by Celine Mans, COA on 09/20/2019 10:12 AM. (History)       ALLERGIES Allergies  Allergen Reactions  . Sulfa Antibiotics Hives  . Trazodone And Nefazodone Swelling    PAST MEDICAL HISTORY Past Medical History:  Diagnosis Date  . Cataract  Combined forms OU  . Diabetes ([place])   . Macular degeneration    Wet OD, Dry OS   Past Surgical History:  Procedure Laterality Date  . APPENDECTOMY      FAMILY HISTORY Family History  Problem Relation Age of Onset  . Cataracts Mother   . Fuch's dystrophy Mother   . Glaucoma Mother   . Diabetes Brother     SOCIAL HISTORY Social History   Tobacco Use  . Smoking status: Former Research scientist (life sciences)  . Smokeless tobacco: Never Used  Substance Use Topics  . Alcohol use: Yes    Alcohol/week: 1.0 standard drinks    Types: 1 Cans of beer per week  . Drug use: Never         OPHTHALMIC EXAM:  Base Eye Exam    Visual Acuity (Snellen - Linear)       Right Left   Dist cc 20/25 -2 20/40 -   Dist ph cc NI 20/30 -2   Correction: Glasses       Tonometry (Tonopen, 10:15 AM)      Right Left   Pressure 15 15       Pupils      Dark Light Shape React APD   Right 4 3 Round Brisk None   Left 4 3 Round Brisk None       Visual Fields (Counting fingers)      Left Right    Full Full       Extraocular Movement      Right Left    Full, Ortho Full, Ortho       Neuro/Psych    Oriented x3: Yes   Mood/Affect: Normal       Dilation    Both eyes: 1.0% Mydriacyl, 2.5% Phenylephrine @ 10:15 AM        Slit Lamp and Fundus Exam    Slit Lamp Exam      Right Left   Lids/Lashes Dermatochalasis - upper lid Dermatochalasis - upper lid   Conjunctiva/Sclera White and quiet White and quiet   Cornea Arcus, otherwise clear Arcus, otherwise clear   Anterior Chamber Deep and quiet Deep and quiet   Iris Round and dilated Round and dilated   Lens 2-3+ Nuclear sclerosis, 2-3+ Cortical cataract 2+ Nuclear sclerosis, 3+ Cortical cataract   Vitreous Vitreous syneresis, Posterior vitreous detachment Vitreous syneresis       Fundus Exam      Right Left   Disc Compact, mild Temporal Peripapillary atrophy, Pink and Sharp Pink and Sharp, mild Temporal Peripapillary atrophy, Compact   C/D Ratio 0.1 0.1   Macula Blunted foveal reflex, central PED with heme resolved, RPE mottling and clumping, Drusen Blunted foveal reflex, Epiretinal membrane, RPE mottling /  Clumping / atophy, Drusen, No heme or edema, patch of RPE atrophy IT macula, stable   Vessels Vascular attenuation, mild a/v crossing changes, mild tortuosity  Vascular attenuation   Periphery Attached, scattered RPE changes, no heme Attached, scattered peripheral drusen           IMAGING AND PROCEDURES  Imaging and Procedures for @TODAY @  OCT, Retina - OU - Both Eyes       Right Eye Quality was good. Central Foveal Thickness: 334. Progression has been stable. Findings include abnormal  foveal contour, no IRF, pigment epithelial detachment, retinal drusen , no SRF (Stable central PED w/ interval improvement in overlying ellipsoid signal).   Left Eye Quality was good. Central Foveal Thickness: 267. Progression has been stable. Findings include  normal foveal contour, epiretinal membrane, retinal drusen , pigment epithelial detachment, outer retinal atrophy, no SRF, no IRF, subretinal hyper-reflective material (Mild interval thickening of ERM; Stable patch of ORA and overlying atrophy IT macula).   Notes *Images captured and stored on drive  Diagnosis / Impression:  OD: Exudative ARMD - no IRF/SRF; stable central PED OS: Non- Exudative ARMD with early atrophy; ERM --Mild interval thickening of ERM; Stable patch of ORA and overlying atrophy IT macula   Clinical management:  See below  Abbreviations: NFP - Normal foveal profile. CME - cystoid macular edema. PED - pigment epithelial detachment. IRF - intraretinal fluid. SRF - subretinal fluid. EZ - ellipsoid zone. ERM - epiretinal membrane. ORA - outer retinal atrophy. ORT - outer retinal tubulation. SRHM - subretinal hyper-reflective material         Intravitreal Injection, Pharmacologic Agent - OD - Right Eye       Time Out 09/20/2019. 10:23 AM. Confirmed correct patient, procedure, site, and patient consented.   Anesthesia Topical anesthesia was used. Anesthetic medications included Lidocaine 2%, Proparacaine 0.5%.   Procedure Preparation included 5% betadine to ocular surface. A supplied needle was used.   Injection:  1.25 mg Bevacizumab (AVASTIN) SOLN   NDC: 70360-001-02, Lot: [id number] , Expiration date: 10/19/2019   Route: Intravitreal, Site: Right Eye, Waste: 0 mg  Post-op Post injection exam found visual acuity of at least counting fingers. The patient tolerated the procedure well. There were no complications. The patient received written and verbal post procedure care education.                  ASSESSMENT/PLAN:    ICD-10-CM   1. Exudative age-related macular degeneration of right eye with active choroidal neovascularization (HCC)  H35.3211 Intravitreal Injection, Pharmacologic Agent - OD - Right Eye    Bevacizumab (AVASTIN) SOLN 1.25 mg  2. Retinal edema  H35.81 OCT, Retina - OU - Both Eyes  3. Intermediate stage nonexudative age-related macular degeneration of left eye  H35.3122   4. Epiretinal membrane (ERM) of left eye  H35.372   5. Posterior vitreous detachment of right eye  H43.811   6. Combined forms of age-related cataract of both eyes  H25.813     1, 2. Exudative age related macular degeneration, OD  - at initial presentation, pt endorsed metamorphopsia   - OCT OD with PED and overlying SRF + drusen  - S/P IVA OD #1 (05.08.19) early resolution, #2 (06.06.19) #3 (07.03.19), #4 (08.13.19), #5 (10.08.19), #6 (12.13.19), #7 (03.12.20),#8 (07.02.20), #9 (10.20.20)  - today, pt reports stable resolution of metamorphopsia   - OCT shows persistent resolution of SRF and stable PED at 16 wk interval  - BCVA stable at 20/25   - discussed treatment options  - pt wishes to be treated with IVA #10 OD today (02.15.21)--maintenance w/ f/u in 16 wks (4 mos) again  - RBA of procedure discussed, questions answered  - informed consent obtained   - Avastin informed consent form signed and scanned on 10.21.2020 (OD)  - see procedure note  - f/u in 16 weeks (4 mos), sooner prn -- DFE/OCT  3. Age related macular degeneration, non-exudative, OS  - The incidence, anatomy, and pathology of dry AMD, risk of progression, and the AREDS and AREDS 2 study including smoking risks discussed with patient.  - continue amsler grid monitoring  4. Epiretinal membrane, OS  - The natural history, anatomy, potential for loss of vision, and treatment options including vitrectomy techniques and the complications  of endophthalmitis, retinal detachment, vitreous hemorrhage, cataract progression and permanent  vision loss discussed with the patient.  - mild ERM  - asymptomatic, no metamorphopsia  - no indication for surgery at this time  - monitor for now  - f/u 4 mos  5. PVD / vitreous syneresis  - Discussed findings and prognosis  - No RT or RD on 360 peripheral exam  - Reviewed s/s of RT/RD  - Strict return precautions for any such RT/RD signs/symptoms  6. Combined form age-related cataract OU-   - The symptoms of cataract, surgical options, and treatments and risks were discussed with patient.  - discussed diagnosis and progression  - monitor for now   Ophthalmic Meds Ordered this visit:  Meds ordered this encounter  Medications  . Bevacizumab (AVASTIN) SOLN 1.25 mg       Return in about 16 weeks (around 01/10/2020) for f/u exu ARMD OD, DFE, OCT.  There are no Patient Instructions on file for this visit.   Explained the diagnoses, plan, and follow up with the patient and they expressed understanding.  Patient expressed understanding of the importance of proper follow up care.   This document serves as a record of services personally performed by Karie Chimera, MD, PhD. It was created on their behalf by Cristopher Estimable, COT an ophthalmic technician. The creation of this record is the provider's dictation and/or activities during the visit.    Electronically signed by: Cristopher Estimable, COT 09/19/19 @ 12:04 PM   This document serves as a record of services personally performed by Karie Chimera, MD, PhD. It was created on their behalf by Laurian Brim, OA, an ophthalmic assistant. The creation of this record is the provider's dictation and/or activities during the visit.    Electronically signed by: Laurian Brim, OA 02.16.2021 12:04 PM  Karie Chimera, M.D., Ph.D. Diseases & Surgery of the Retina and Vitreous [facility] 09/20/2019   I have reviewed the above documentation for accuracy and completeness, and I agree with the above. Karie Chimera, M.D.,  Ph.D. 09/20/19 12:04 PM   Abbreviations: M myopia (nearsighted); A astigmatism; H hyperopia (farsighted); P presbyopia; Mrx spectacle prescription;  CTL contact lenses; OD right eye; OS left eye; OU both eyes  XT exotropia; ET esotropia; PEK punctate epithelial keratitis; PEE punctate epithelial erosions; DES dry eye syndrome; MGD meibomian gland dysfunction; ATs artificial tears; PFAT's preservative free artificial tears; NSC nuclear sclerotic cataract; PSC posterior subcapsular cataract; ERM epi-retinal membrane; PVD posterior vitreous detachment; RD retinal detachment; DM diabetes mellitus; DR diabetic retinopathy; NPDR non-proliferative diabetic retinopathy; PDR proliferative diabetic retinopathy; CSME clinically significant macular edema; DME diabetic macular edema; dbh dot blot hemorrhages; CWS cotton wool spot; POAG primary open angle glaucoma; C/D cup-to-disc ratio; HVF humphrey visual field; GVF goldmann visual field; OCT optical coherence tomography; IOP intraocular pressure; BRVO Branch retinal vein occlusion; CRVO central retinal vein occlusion; CRAO central retinal artery occlusion; BRAO branch retinal artery occlusion; RT retinal tear; SB scleral buckle; PPV pars plana vitrectomy; VH Vitreous hemorrhage; PRP panretinal laser photocoagulation; IVK intravitreal kenalog; VMT vitreomacular traction; MH Macular hole;  NVD neovascularization of the disc; NVE neovascularization elsewhere; AREDS age related eye disease study; ARMD age related macular degeneration; POAG primary open angle glaucoma; EBMD epithelial/anterior basement membrane dystrophy; ACIOL anterior chamber intraocular lens; IOL intraocular lens; PCIOL posterior chamber intraocular lens; Phaco/IOL phacoemulsification with intraocular lens placement; PRK photorefractive keratectomy; LASIK laser assisted in situ keratomileusis; HTN hypertension; DM diabetes mellitus;  COPD chronic obstructive pulmonary disease

## 2019-09-20 ENCOUNTER — Encounter (INDEPENDENT_AMBULATORY_CARE_PROVIDER_SITE_OTHER): Payer: Self-pay | Admitting: Ophthalmology

## 2019-09-20 ENCOUNTER — Ambulatory Visit (INDEPENDENT_AMBULATORY_CARE_PROVIDER_SITE_OTHER): Payer: Medicare Other | Admitting: Ophthalmology

## 2019-09-20 DIAGNOSIS — H35372 Puckering of macula, left eye: Secondary | ICD-10-CM | POA: Diagnosis not present

## 2019-09-20 DIAGNOSIS — H25813 Combined forms of age-related cataract, bilateral: Secondary | ICD-10-CM

## 2019-09-20 DIAGNOSIS — H43811 Vitreous degeneration, right eye: Secondary | ICD-10-CM

## 2019-09-20 DIAGNOSIS — H3581 Retinal edema: Secondary | ICD-10-CM

## 2019-09-20 DIAGNOSIS — H353211 Exudative age-related macular degeneration, right eye, with active choroidal neovascularization: Secondary | ICD-10-CM | POA: Diagnosis not present

## 2019-09-20 DIAGNOSIS — H353122 Nonexudative age-related macular degeneration, left eye, intermediate dry stage: Secondary | ICD-10-CM

## 2019-09-20 MED ORDER — BEVACIZUMAB CHEMO INJECTION 1.25MG/0.05ML SYRINGE FOR KALEIDOSCOPE
1.2500 mg | INTRAVITREAL | Status: AC | PRN
  Administered 2019-09-20: 12:00:00 1.25 mg via INTRAVITREAL

## 2019-09-21 ENCOUNTER — Encounter (INDEPENDENT_AMBULATORY_CARE_PROVIDER_SITE_OTHER): Payer: Medicare Other | Admitting: Ophthalmology

## 2020-01-17 NOTE — Progress Notes (Signed)
[facility] - Clinic Note  01/18/2020     CHIEF COMPLAINT Patient presents for Retina Follow Up   HISTORY OF PRESENT ILLNESS: Timothy Pruitt is a 74 y.o. male who presents to the clinic today for:   HPI    Retina Follow Up    Patient presents with  Wet AMD.  In right eye.  This started years ago.  Severity is moderate.  Duration of 16 weeks.  Since onset it is stable.  I, the attending physician,  performed the HPI with the patient and updated documentation appropriately.          Comments    74 y/o male pt here for 16 wk f/u for exu ARMD OD.  No change in [place] OU.  Denies pain, FOL.  Has an occasional small floater OU.  No gtts.       Last edited by Rennis Chris, MD on 01/18/2020 11:56 AM. (History)    pt states he is doing well, it has been 17 weeks since his last injection  Referring physician: Theodoro Kos, MD [id number] [place],  [place] [address]  HISTORICAL INFORMATION:   Selected notes from the MEDICAL RECORD NUMBER Referred by Dr. [name] for concern of exudative ARMD OD    CURRENT MEDICATIONS: No current outpatient medications on file. (Ophthalmic Drugs)   No current facility-administered medications for this visit. (Ophthalmic Drugs)   Current Outpatient Medications (Other)  Medication Sig  . amoxicillin-clavulanate (AUGMENTIN) 875-125 MG tablet TK 1 T PO  BID WITH FOOD FOR INFECTION  . aspirin EC 81 MG tablet 1 po q day  . buPROPion (WELLBUTRIN SR) 150 MG 12 hr tablet TK 1 T PO QD  . busPIRone (BUSPAR) 10 MG tablet   . DOCOSAHEXAENOIC ACID PO Take by mouth.  . doxazosin (CARDURA) 4 MG tablet   . DULoxetine (CYMBALTA) 30 MG capsule Take 30 mg by mouth daily.  . Fingerstix Lancets MISC Inject into the skin.  Marland Kitchen glucose blood (ACCU-CHEK AVIVA PLUS) test strip 1 strip by external route every day  . losartan (COZAAR) 100 MG tablet TK 1 T PO QD  . meloxicam (MOBIC) 15 MG tablet TK 1 T PO QD  . Multiple Vitamins-Minerals (ICAPS  AREDS 2 PO) Take by mouth.  . Omega-3 Fatty Acids (KP FISH OIL) 1200 MG CAPS Take by mouth.  . rosuvastatin (CRESTOR) 5 MG tablet Take by mouth.  . sildenafil (REVATIO) 20 MG tablet Take by mouth.  . tamsulosin (FLOMAX) 0.4 MG CAPS capsule Take 0.4 mg by mouth daily.  Marland Kitchen buPROPion (WELLBUTRIN SR) 150 MG 12 hr tablet Take 150 mg by mouth once. (Patient not taking: Reported on 01/18/2020)  . doxazosin (CARDURA) 4 MG tablet Take 4 mg by mouth daily. (Patient not taking: Reported on 01/18/2020)  . losartan (COZAAR) 100 MG tablet Take 100 mg by mouth daily. (Patient not taking: Reported on 01/18/2020)   Current Facility-Administered Medications (Other)  Medication Route  . Bevacizumab (AVASTIN) SOLN 1.25 mg Intravitreal  . Bevacizumab (AVASTIN) SOLN 1.25 mg Intravitreal  . Bevacizumab (AVASTIN) SOLN 1.25 mg Intravitreal  . Bevacizumab (AVASTIN) SOLN 1.25 mg Intravitreal  . Bevacizumab (AVASTIN) SOLN 1.25 mg Intravitreal  . Bevacizumab (AVASTIN) SOLN 1.25 mg Intravitreal  . Bevacizumab (AVASTIN) SOLN 1.25 mg Intravitreal      REVIEW OF SYSTEMS: ROS    Positive for: Eyes   Negative for: Constitutional, Gastrointestinal, Neurological, Skin, Genitourinary, Musculoskeletal, HENT, Endocrine, Cardiovascular, Respiratory, Psychiatric, Allergic/Imm, Heme/Lymph  Last edited by Matthew Folks, COA on 01/18/2020  9:53 AM. (History)       ALLERGIES Allergies  Allergen Reactions  . Sulfa Antibiotics Hives  . Trazodone And Nefazodone Swelling    PAST MEDICAL HISTORY Past Medical History:  Diagnosis Date  . Cataract    Combined forms OU  . Diabetes ([place])   . Macular degeneration    Wet OD, Dry OS   Past Surgical History:  Procedure Laterality Date  . APPENDECTOMY      FAMILY HISTORY Family History  Problem Relation Age of Onset  . Cataracts Mother   . Fuch's dystrophy Mother   . Glaucoma Mother   . Diabetes Brother     SOCIAL HISTORY Social History   Tobacco Use  . Smoking  status: Former Research scientist (life sciences)  . Smokeless tobacco: Never Used  Vaping Use  . Vaping Use: Never used  Substance Use Topics  . Alcohol use: Yes    Alcohol/week: 1.0 standard drink    Types: 1 Cans of beer per week  . Drug use: Never         OPHTHALMIC EXAM:  Base Eye Exam    Visual Acuity (Snellen - Linear)      Right Left   Dist cc 20/25 -2 20/30 -2   Dist ph cc NI NI   Correction: Glasses       Tonometry (Tonopen, 9:56 AM)      Right Left   Pressure 15 15       Pupils      Dark Light Shape React APD   Right 4 3 Round Brisk None   Left 4 3 Round Brisk None       Visual Fields (Counting fingers)      Left Right    Full Full       Extraocular Movement      Right Left    Full, Ortho Full, Ortho       Neuro/Psych    Oriented x3: Yes   Mood/Affect: Normal       Dilation    Both eyes: 1.0% Mydriacyl, 2.5% Phenylephrine @ 9:56 AM        Slit Lamp and Fundus Exam    Slit Lamp Exam      Right Left   Lids/Lashes Dermatochalasis - upper lid Dermatochalasis - upper lid   Conjunctiva/Sclera White and quiet White and quiet   Cornea Arcus, otherwise clear Arcus, otherwise clear   Anterior Chamber Deep and quiet Deep and quiet   Iris Round and dilated Round and dilated   Lens 2-3+ Nuclear sclerosis, 2-3+ Cortical cataract 2+ Nuclear sclerosis, 3+ Cortical cataract   Vitreous Vitreous syneresis, Posterior vitreous detachment Vitreous syneresis       Fundus Exam      Right Left   Disc Compact, mild Temporal Peripapillary atrophy, Pink and Sharp Pink and Sharp, mild Temporal Peripapillary atrophy, Compact   C/D Ratio 0.1 0.1   Macula Blunted foveal reflex, central PED/CNVM with ?trace early recurrence of heme, RPE mottling and clumping, Drusen Blunted foveal reflex, Epiretinal membrane, RPE mottling /  Clumping / atophy, Drusen, No heme or edema, patch of RPE atrophy IT macula, stable   Vessels Vascular attenuation, mild a/v crossing changes, mild tortuosity  Vascular  attenuation   Periphery Attached, scattered RPE changes, no heme Attached, scattered peripheral drusen           IMAGING AND PROCEDURES  Imaging and Procedures for @TODAY @  OCT, Retina - OU -  Both Eyes       Right Eye Quality was good. Central Foveal Thickness: 334. Progression has been stable. Findings include abnormal foveal contour, no IRF, pigment epithelial detachment, retinal drusen , no SRF (Stable central PED w/ interval improvement in overlying ellipsoid signal).   Left Eye Quality was good. Central Foveal Thickness: 306. Progression has been stable. Findings include normal foveal contour, epiretinal membrane, retinal drusen , pigment epithelial detachment, outer retinal atrophy, no SRF, no IRF, subretinal hyper-reflective material (Persistent ERM; Stable patch of ORA and overlying atrophy IT macula).   Notes *Images captured and stored on drive  Diagnosis / Impression:  OD: Exudative ARMD - no IRF/SRF; stable central PED OS: Non- Exudative ARMD with early atrophy; ERM -- persistent; Stable patch of ORA and overlying atrophy IT macula   Clinical management:  See below  Abbreviations: NFP - Normal foveal profile. CME - cystoid macular edema. PED - pigment epithelial detachment. IRF - intraretinal fluid. SRF - subretinal fluid. EZ - ellipsoid zone. ERM - epiretinal membrane. ORA - outer retinal atrophy. ORT - outer retinal tubulation. SRHM - subretinal hyper-reflective material         Intravitreal Injection, Pharmacologic Agent - OD - Right Eye       Time Out 01/18/2020. 10:56 AM. Confirmed correct patient, procedure, site, and patient consented.   Anesthesia Topical anesthesia was used. Anesthetic medications included Lidocaine 2%, Proparacaine 0.5%.   Procedure Preparation included eyelid speculum, 5% betadine to ocular surface. A supplied needle was used.   Injection:  1.25 mg Bevacizumab (AVASTIN) SOLN   NDC: 54008-676-19, Lot: [id number] , Expiration  date: 04/30/2020   Route: Intravitreal, Site: Right Eye, Waste: 0 mL  Post-op Post injection exam found visual acuity of at least counting fingers. The patient tolerated the procedure well. There were no complications. The patient received written and verbal post procedure care education.                 ASSESSMENT/PLAN:    ICD-10-CM   1. Exudative age-related macular degeneration of right eye with active choroidal neovascularization (HCC)  H35.3211 Intravitreal Injection, Pharmacologic Agent - OD - Right Eye    Bevacizumab (AVASTIN) SOLN 1.25 mg  2. Retinal edema  H35.81 OCT, Retina - OU - Both Eyes  3. Intermediate stage nonexudative age-related macular degeneration of left eye  H35.3122   4. Epiretinal membrane (ERM) of left eye  H35.372   5. Posterior vitreous detachment of right eye  H43.811   6. Combined forms of age-related cataract of both eyes  H25.813     1, 2. Exudative age related macular degeneration, OD  - at initial presentation, pt endorsed metamorphopsia   - OCT OD with PED and overlying SRF + drusen  - S/P IVA OD #1 (05.08.19) early resolution, #2 (06.06.19) #3 (07.03.19), #4 (08.13.19), #5 (10.08.19), #6 (12.13.19), #7 (03.12.20),#8 (07.02.20), #9 (10.20.20), #10 (02.15.21)  - today, pt reports stable resolution of metamorphopsia   - OCT shows persistent resolution of SRF and stable PED at 17 wk interval  - BCVA stable at 20/25   - exam shows ?tr early recurrence of heme around PED/CNVM  - discussed treatment options  - recommend IVA OD #11 today, 06.16.21  - pt wishes to proceed  - RBA of procedure discussed, questions answered  - see procedure note  - Avastin informed consent form signed and scanned on 10.21.2020 (OD)  - see procedure note  - f/u in 16 weeks, sooner prn -- DFE/OCT  3. Age  related macular degeneration, non-exudative, OS  - The incidence, anatomy, and pathology of dry AMD, risk of progression, and the AREDS and AREDS 2 study including  smoking risks discussed with patient.  - continue amsler grid monitoring  4. Epiretinal membrane, OS  - mild ERM  - asymptomatic, no metamorphopsia  - no indication for surgery at this time  - monitor for now  - f/u in 4 months  5. PVD / vitreous syneresis  - Discussed findings and prognosis  - No RT or RD on 360 peripheral exam  - Reviewed s/s of RT/RD  - Strict return precautions for any such RT/RD signs/symptoms  6. Combined form age-related cataract OU-   - The symptoms of cataract, surgical options, and treatments and risks were discussed with patient.  - discussed diagnosis and progression  - monitor for now   Ophthalmic Meds Ordered this visit:  Meds ordered this encounter  Medications  . Bevacizumab (AVASTIN) SOLN 1.25 mg       Return in about 16 weeks (around 05/09/2020) for f/u 16 weeks, exu ARMD OD, DFE, OCT.  There are no Patient Instructions on file for this visit.   Explained the diagnoses, plan, and follow up with the patient and they expressed understanding.  Patient expressed understanding of the importance of proper follow up care.   This document serves as a record of services personally performed by Karie Chimera, MD, PhD. It was created on their behalf by Laurian Brim, OA, an ophthalmic assistant. The creation of this record is the provider's dictation and/or activities during the visit.    Electronically signed by: Laurian Brim, OA 06.16.2021 11:58 AM  Karie Chimera, M.D., Ph.D. Diseases & Surgery of the Retina and Vitreous [facility] 01/18/2020   I have reviewed the above documentation for accuracy and completeness, and I agree with the above. Karie Chimera, M.D., Ph.D. 01/18/20 11:58 AM   Abbreviations: M myopia (nearsighted); A astigmatism; H hyperopia (farsighted); P presbyopia; Mrx spectacle prescription;  CTL contact lenses; OD right eye; OS left eye; OU both eyes  XT exotropia; ET esotropia; PEK punctate  epithelial keratitis; PEE punctate epithelial erosions; DES dry eye syndrome; MGD meibomian gland dysfunction; ATs artificial tears; PFAT's preservative free artificial tears; NSC nuclear sclerotic cataract; PSC posterior subcapsular cataract; ERM epi-retinal membrane; PVD posterior vitreous detachment; RD retinal detachment; DM diabetes mellitus; DR diabetic retinopathy; NPDR non-proliferative diabetic retinopathy; PDR proliferative diabetic retinopathy; CSME clinically significant macular edema; DME diabetic macular edema; dbh dot blot hemorrhages; CWS cotton wool spot; POAG primary open angle glaucoma; C/D cup-to-disc ratio; HVF humphrey visual field; GVF goldmann visual field; OCT optical coherence tomography; IOP intraocular pressure; BRVO Branch retinal vein occlusion; CRVO central retinal vein occlusion; CRAO central retinal artery occlusion; BRAO branch retinal artery occlusion; RT retinal tear; SB scleral buckle; PPV pars plana vitrectomy; VH Vitreous hemorrhage; PRP panretinal laser photocoagulation; IVK intravitreal kenalog; VMT vitreomacular traction; MH Macular hole;  NVD neovascularization of the disc; NVE neovascularization elsewhere; AREDS age related eye disease study; ARMD age related macular degeneration; POAG primary open angle glaucoma; EBMD epithelial/anterior basement membrane dystrophy; ACIOL anterior chamber intraocular lens; IOL intraocular lens; PCIOL posterior chamber intraocular lens; Phaco/IOL phacoemulsification with intraocular lens placement; PRK photorefractive keratectomy; LASIK laser assisted in situ keratomileusis; HTN hypertension; DM diabetes mellitus; COPD chronic obstructive pulmonary disease

## 2020-01-18 ENCOUNTER — Encounter (INDEPENDENT_AMBULATORY_CARE_PROVIDER_SITE_OTHER): Payer: Self-pay | Admitting: Ophthalmology

## 2020-01-18 ENCOUNTER — Ambulatory Visit (INDEPENDENT_AMBULATORY_CARE_PROVIDER_SITE_OTHER): Payer: Medicare Other | Admitting: Ophthalmology

## 2020-01-18 ENCOUNTER — Other Ambulatory Visit: Payer: Self-pay

## 2020-01-18 DIAGNOSIS — H35372 Puckering of macula, left eye: Secondary | ICD-10-CM | POA: Diagnosis not present

## 2020-01-18 DIAGNOSIS — H25813 Combined forms of age-related cataract, bilateral: Secondary | ICD-10-CM

## 2020-01-18 DIAGNOSIS — H43811 Vitreous degeneration, right eye: Secondary | ICD-10-CM

## 2020-01-18 DIAGNOSIS — H353211 Exudative age-related macular degeneration, right eye, with active choroidal neovascularization: Secondary | ICD-10-CM | POA: Diagnosis not present

## 2020-01-18 DIAGNOSIS — H353122 Nonexudative age-related macular degeneration, left eye, intermediate dry stage: Secondary | ICD-10-CM | POA: Diagnosis not present

## 2020-01-18 DIAGNOSIS — H3581 Retinal edema: Secondary | ICD-10-CM

## 2020-01-18 MED ORDER — BEVACIZUMAB CHEMO INJECTION 1.25MG/0.05ML SYRINGE FOR KALEIDOSCOPE
1.2500 mg | INTRAVITREAL | Status: AC | PRN
  Administered 2020-01-18: 1.25 mg via INTRAVITREAL

## 2020-05-09 ENCOUNTER — Encounter (INDEPENDENT_AMBULATORY_CARE_PROVIDER_SITE_OTHER): Payer: Medicare Other | Admitting: Ophthalmology

## 2020-05-16 ENCOUNTER — Encounter (INDEPENDENT_AMBULATORY_CARE_PROVIDER_SITE_OTHER): Payer: Medicare Other | Admitting: Ophthalmology

## 2020-05-17 NOTE — Progress Notes (Signed)
[facility] - Clinic Note  05/21/2020     CHIEF COMPLAINT Patient presents for Retina Follow Up   HISTORY OF PRESENT ILLNESS: Timothy CornersLarry Pruitt is a 74 y.o. male who presents to the clinic today for:   HPI    Retina Follow Up    Patient presents with  Wet AMD.  In right eye.  Duration of 17 weeks.  Since onset it is gradually worsening.  I, the attending physician,  performed the HPI with the patient and updated documentation appropriately.          Comments    17 week follow up Exu ARMD OD- Slight change in OD when reading newspapers.  O/W vision is stable.  BS: 136 last week A1C: 6.5- no medications       Last edited by Rennis ChrisZamora, Jozey Janco, MD on 05/21/2020  2:21 PM. (History)    pt is delayed to follow up from 16 weeks to 17.5 weeks, pt states right eye vision seems a little blurry when he is trying to read the newspaper, pts general eye dr is Dr. Hazel SamsFredreichs in [place]  Referring physician: Kaylyn LimFriedrichs, Jill, OD [address] [place],  [place] [id number]  HISTORICAL INFORMATION:   Selected notes from the MEDICAL RECORD NUMBER Referred by Dr. Jonny RuizKenneth Giusto for concern of exudative ARMD OD    CURRENT MEDICATIONS: No current outpatient medications on file. (Ophthalmic Drugs)   No current facility-administered medications for this visit. (Ophthalmic Drugs)   Current Outpatient Medications (Other)  Medication Sig  . aspirin EC 81 MG tablet 1 po q day  . buPROPion (WELLBUTRIN SR) 150 MG 12 hr tablet TK 1 T PO QD  . busPIRone (BUSPAR) 10 MG tablet   . DOCOSAHEXAENOIC ACID PO Take by mouth.  . doxazosin (CARDURA) 4 MG tablet   . DULoxetine (CYMBALTA) 30 MG capsule Take 30 mg by mouth daily.  Marland Kitchen. losartan (COZAAR) 100 MG tablet TK 1 T PO QD  . meloxicam (MOBIC) 15 MG tablet TK 1 T PO QD  . Multiple Vitamins-Minerals (ICAPS AREDS 2 PO) Take by mouth.  . rosuvastatin (CRESTOR) 5 MG tablet Take by mouth.  . sildenafil (REVATIO) 20 MG tablet Take by mouth.  .  tamsulosin (FLOMAX) 0.4 MG CAPS capsule Take 0.4 mg by mouth daily.  Marland Kitchen. amoxicillin-clavulanate (AUGMENTIN) 875-125 MG tablet TK 1 T PO  BID WITH FOOD FOR INFECTION (Patient not taking: Reported on 05/21/2020)  . buPROPion (WELLBUTRIN SR) 150 MG 12 hr tablet Take 150 mg by mouth once.   . doxazosin (CARDURA) 4 MG tablet Take 4 mg by mouth daily. (Patient not taking: Reported on 01/18/2020)  . Fingerstix Lancets MISC Inject into the skin.  Marland Kitchen. glucose blood (ACCU-CHEK AVIVA PLUS) test strip 1 strip by external route every day  . losartan (COZAAR) 100 MG tablet Take 100 mg by mouth daily. (Patient not taking: Reported on 01/18/2020)  . Omega-3 Fatty Acids (KP FISH OIL) 1200 MG CAPS Take by mouth.   Current Facility-Administered Medications (Other)  Medication Route  . Bevacizumab (AVASTIN) SOLN 1.25 mg Intravitreal  . Bevacizumab (AVASTIN) SOLN 1.25 mg Intravitreal  . Bevacizumab (AVASTIN) SOLN 1.25 mg Intravitreal  . Bevacizumab (AVASTIN) SOLN 1.25 mg Intravitreal  . Bevacizumab (AVASTIN) SOLN 1.25 mg Intravitreal  . Bevacizumab (AVASTIN) SOLN 1.25 mg Intravitreal  . Bevacizumab (AVASTIN) SOLN 1.25 mg Intravitreal      REVIEW OF SYSTEMS: ROS    Positive for: Genitourinary, Endocrine, Eyes   Negative for: Constitutional, Gastrointestinal,  Neurological, Skin, Musculoskeletal, HENT, Cardiovascular, Respiratory, Psychiatric, Allergic/Imm, Heme/Lymph   Last edited by Joni Reining, COA on 05/21/2020  1:37 PM. (History)       ALLERGIES Allergies  Allergen Reactions  . Sulfa Antibiotics Hives  . Trazodone And Nefazodone Swelling    PAST MEDICAL HISTORY Past Medical History:  Diagnosis Date  . Cataract    Combined forms OU  . Diabetes (HCC)   . Macular degeneration    Wet OD, Dry OS   Past Surgical History:  Procedure Laterality Date  . APPENDECTOMY      FAMILY HISTORY Family History  Problem Relation Age of Onset  . Cataracts Mother   . Fuch's dystrophy Mother   . Glaucoma  Mother   . Diabetes Brother     SOCIAL HISTORY Social History   Tobacco Use  . Smoking status: Former Games developer  . Smokeless tobacco: Never Used  Vaping Use  . Vaping Use: Never used  Substance Use Topics  . Alcohol use: Yes    Alcohol/week: 1.0 standard drink    Types: 1 Cans of beer per week  . Drug use: Never         OPHTHALMIC EXAM:  Base Eye Exam    Visual Acuity (Snellen - Linear)      Right Left   Dist cc 20/25 -1 20/50 +2   Dist ph cc  20/40 +1       Tonometry (Tonopen, 1:47 PM)      Right Left   Pressure 15 12       Pupils      Dark Light Shape React APD   Right 4 3 Round Brisk None   Left 4 3 Round Brisk None       Visual Fields (Counting fingers)      Left Right    Full Full       Extraocular Movement      Right Left    Full Full       Neuro/Psych    Oriented x3: Yes   Mood/Affect: Normal       Dilation    Both eyes: 1.0% Mydriacyl, 2.5% Phenylephrine @ 1:47 PM        Slit Lamp and Fundus Exam    Slit Lamp Exam      Right Left   Lids/Lashes Dermatochalasis - upper lid Dermatochalasis - upper lid   Conjunctiva/Sclera White and quiet White and quiet   Cornea Arcus, otherwise clear Arcus, otherwise clear   Anterior Chamber Deep and quiet Deep and quiet   Iris Round and dilated Round and dilated   Lens 2-3+ Nuclear sclerosis with early brunescence, 2-3+ Cortical cataract 2+ Nuclear sclerosis with brunescence, 3+ Cortical cataract   Vitreous Vitreous syneresis, Posterior vitreous detachment Vitreous syneresis       Fundus Exam      Right Left   Disc Compact, mild Temporal Peripapillary atrophy, Pink and Sharp Pink and Sharp, mild Temporal Peripapillary atrophy, Compact   C/D Ratio 0.1 0.1   Macula Blunted foveal reflex, central PED/CNVM with ?trace early recurrence of heme, RPE mottling and clumping, Drusen, no heme Blunted foveal reflex, Epiretinal membrane, RPE mottling /  Clumping / atophy, Drusen, No heme or edema, patch of RPE  atrophy IT macula, stable   Vessels Vascular attenuation, mild a/v crossing changes, mild tortuosity  Vascular attenuation   Periphery Attached, scattered RPE changes, no heme, peripheral drusen Attached, scattered peripheral drusen         Refraction  Wearing Rx      Sphere Cylinder Axis   Right -3.25 +0.50 016   Left -2.75 Sphere    Type: SVL       Manifest Refraction      Sphere Cylinder Axis Dist [place]   Right -3.50 Sphere  20/25   Left -3.00 +0.75 165 20/40-2          IMAGING AND PROCEDURES  Imaging and Procedures for @TODAY @  OCT, Retina - OU - Both Eyes       Right Eye Quality was good. Central Foveal Thickness: 329. Progression has been stable. Findings include abnormal foveal contour, no IRF, pigment epithelial detachment, retinal drusen , no SRF, epiretinal membrane (Stable central PED w/ interval improvement in overlying ellipsoid signal).   Left Eye Quality was good. Central Foveal Thickness: 268. Progression has been stable. Findings include normal foveal contour, epiretinal membrane, retinal drusen , pigment epithelial detachment, outer retinal atrophy, no SRF, no IRF, subretinal hyper-reflective material (Persistent ERM; Stable patch of ORA and overlying atrophy IT macula).   Notes *Images captured and stored on drive  Diagnosis / Impression:  OD: Exudative ARMD - no IRF/SRF; stable central PED OS: Non- Exudative ARMD with early atrophy; ERM -- persistent; Stable patch of ORA and overlying atrophy IT macula   Clinical management:  See below  Abbreviations: NFP - Normal foveal profile. CME - cystoid macular edema. PED - pigment epithelial detachment. IRF - intraretinal fluid. SRF - subretinal fluid. EZ - ellipsoid zone. ERM - epiretinal membrane. ORA - outer retinal atrophy. ORT - outer retinal tubulation. SRHM - subretinal hyper-reflective material                  ASSESSMENT/PLAN:    ICD-10-CM   1. Exudative age-related macular degeneration  of right eye with active choroidal neovascularization (HCC)  H35.3211   2. Retinal edema  H35.81 OCT, Retina - OU - Both Eyes  3. Intermediate stage nonexudative age-related macular degeneration of left eye  H35.3122   4. Epiretinal membrane (ERM) of left eye  H35.372   5. Posterior vitreous detachment of right eye  H43.811   6. Combined forms of age-related cataract of both eyes  H25.813     1, 2. Exudative age related macular degeneration, OD  - at initial presentation, pt endorsed metamorphopsia   - OCT OD with PED and overlying SRF + drusen  - S/P IVA OD #1 (05.08.19) early resolution, #2 (06.06.19) #3 (07.03.19), #4 (08.13.19), #5 (10.08.19), #6 (12.13.19), #7 (03.12.20),#8 (07.02.20), #9 (10.20.20), #10 (02.15.21), #11 (06.16.21)  - today, pt reports stable resolution of metamorphopsia   - OCT shows persistent resolution of SRF and stable PED at 17.5 wk interval  - BCVA stable at 20/25   - exam stable without heme  - discussed treatment options  - recommend holding IVA OD today  - pt in agreement  - f/u in 3 months, sooner prn -- DFE/OCT  3. Age related macular degeneration, non-exudative, OS  - The incidence, anatomy, and pathology of dry AMD, risk of progression, and the AREDS and AREDS 2 study including smoking risks discussed with patient.  - continue amsler grid monitoring  4. Epiretinal membrane, OS  - mild ERM  - asymptomatic, no metamorphopsia  - no indication for surgery at this time  - monitor for now  - f/u in 4 months  5. PVD / vitreous syneresis  - Discussed findings and prognosis  - No RT or RD on 360 peripheral exam  -  Reviewed s/s of RT/RD  - Strict return precautions for any such RT/RD signs/symptoms  6. Combined form age-related cataract OU-   - The symptoms of cataract, surgical options, and treatments and risks were discussed with patient.  - mild progression on exam today  - mild decrease in [place] to 20/40 from 20/30  - recommend cataract  eval  Ophthalmic Meds Ordered this visit:  No orders of the defined types were placed in this encounter.      Return in about 3 months (around 08/21/2020) for f/u exu ARMD OD, DFE, OCT.  There are no Patient Instructions on file for this visit.  This document serves as a record of services personally performed by Karie Chimera, MD, PhD. It was created on their behalf by Herby Abraham, COA, an ophthalmic technician. The creation of this record is the provider's dictation and/or activities during the visit.    Electronically signed by: Herby Abraham, COA 10.14.2021 2:44 PM   This document serves as a record of services personally performed by Karie Chimera, MD, PhD. It was created on their behalf by Glee Arvin. Manson Passey, OA an ophthalmic technician. The creation of this record is the provider's dictation and/or activities during the visit.    Electronically signed by: Glee Arvin. Manson Passey, [place] 10.18.2021 2:44 PM  Karie Chimera, M.D., Ph.D. Diseases & Surgery of the Retina and Vitreous [facility] 05/21/2020   I have reviewed the above documentation for accuracy and completeness, and I agree with the above. Karie Chimera, M.D., Ph.D. 05/21/20 2:44 PM   Abbreviations: M myopia (nearsighted); A astigmatism; H hyperopia (farsighted); P presbyopia; Mrx spectacle prescription;  CTL contact lenses; OD right eye; OS left eye; OU both eyes  XT exotropia; ET esotropia; PEK punctate epithelial keratitis; PEE punctate epithelial erosions; DES dry eye syndrome; MGD meibomian gland dysfunction; ATs artificial tears; PFAT's preservative free artificial tears; NSC nuclear sclerotic cataract; PSC posterior subcapsular cataract; ERM epi-retinal membrane; PVD posterior vitreous detachment; RD retinal detachment; DM diabetes mellitus; DR diabetic retinopathy; NPDR non-proliferative diabetic retinopathy; PDR proliferative diabetic retinopathy; CSME clinically significant macular edema; DME  diabetic macular edema; dbh dot blot hemorrhages; CWS cotton wool spot; POAG primary open angle glaucoma; C/D cup-to-disc ratio; HVF humphrey visual field; GVF goldmann visual field; OCT optical coherence tomography; IOP intraocular pressure; BRVO Branch retinal vein occlusion; CRVO central retinal vein occlusion; CRAO central retinal artery occlusion; BRAO branch retinal artery occlusion; RT retinal tear; SB scleral buckle; PPV pars plana vitrectomy; VH Vitreous hemorrhage; PRP panretinal laser photocoagulation; IVK intravitreal kenalog; VMT vitreomacular traction; MH Macular hole;  NVD neovascularization of the disc; NVE neovascularization elsewhere; AREDS age related eye disease study; ARMD age related macular degeneration; POAG primary open angle glaucoma; EBMD epithelial/anterior basement membrane dystrophy; ACIOL anterior chamber intraocular lens; IOL intraocular lens; PCIOL posterior chamber intraocular lens; Phaco/IOL phacoemulsification with intraocular lens placement; PRK photorefractive keratectomy; LASIK laser assisted in situ keratomileusis; HTN hypertension; DM diabetes mellitus; COPD chronic obstructive pulmonary disease

## 2020-05-21 ENCOUNTER — Encounter (INDEPENDENT_AMBULATORY_CARE_PROVIDER_SITE_OTHER): Payer: Medicare Other | Admitting: Ophthalmology

## 2020-05-21 ENCOUNTER — Other Ambulatory Visit: Payer: Self-pay

## 2020-05-21 ENCOUNTER — Encounter (INDEPENDENT_AMBULATORY_CARE_PROVIDER_SITE_OTHER): Payer: Self-pay | Admitting: Ophthalmology

## 2020-05-21 ENCOUNTER — Ambulatory Visit (INDEPENDENT_AMBULATORY_CARE_PROVIDER_SITE_OTHER): Payer: Medicare Other | Admitting: Ophthalmology

## 2020-05-21 DIAGNOSIS — H353122 Nonexudative age-related macular degeneration, left eye, intermediate dry stage: Secondary | ICD-10-CM

## 2020-05-21 DIAGNOSIS — H25813 Combined forms of age-related cataract, bilateral: Secondary | ICD-10-CM

## 2020-05-21 DIAGNOSIS — H3581 Retinal edema: Secondary | ICD-10-CM | POA: Diagnosis not present

## 2020-05-21 DIAGNOSIS — H353211 Exudative age-related macular degeneration, right eye, with active choroidal neovascularization: Secondary | ICD-10-CM | POA: Diagnosis not present

## 2020-05-21 DIAGNOSIS — H35372 Puckering of macula, left eye: Secondary | ICD-10-CM

## 2020-05-21 DIAGNOSIS — H43811 Vitreous degeneration, right eye: Secondary | ICD-10-CM

## 2020-05-22 ENCOUNTER — Encounter (INDEPENDENT_AMBULATORY_CARE_PROVIDER_SITE_OTHER): Payer: Medicare Other | Admitting: Ophthalmology

## 2020-05-24 ENCOUNTER — Encounter (INDEPENDENT_AMBULATORY_CARE_PROVIDER_SITE_OTHER): Payer: Medicare Other | Admitting: Ophthalmology

## 2020-07-12 ENCOUNTER — Ambulatory Visit (HOSPITAL_COMMUNITY): Payer: Medicare Other

## 2020-07-12 ENCOUNTER — Other Ambulatory Visit: Payer: Self-pay | Admitting: Internal Medicine

## 2020-07-12 ENCOUNTER — Other Ambulatory Visit: Payer: Self-pay

## 2020-07-12 ENCOUNTER — Ambulatory Visit (HOSPITAL_COMMUNITY)
Admission: RE | Admit: 2020-07-12 | Discharge: 2020-07-12 | Disposition: A | Discharge status: Home / Self Care | Payer: Medicare Other | Source: Ambulatory Visit | Attending: Internal Medicine | Admitting: Internal Medicine

## 2020-07-12 DIAGNOSIS — R1030 Lower abdominal pain, unspecified: Secondary | ICD-10-CM

## 2020-07-12 DIAGNOSIS — N200 Calculus of kidney: Secondary | ICD-10-CM | POA: Insufficient documentation

## 2020-07-16 ENCOUNTER — Other Ambulatory Visit (HOSPITAL_COMMUNITY): Payer: Medicare Other

## 2020-08-21 ENCOUNTER — Encounter (INDEPENDENT_AMBULATORY_CARE_PROVIDER_SITE_OTHER): Payer: Medicare Other | Admitting: Ophthalmology

## 2020-08-21 NOTE — Progress Notes (Shared)
[facility] - Clinic Note  08/21/2020     CHIEF COMPLAINT Patient presents for No chief complaint on file.   HISTORY OF PRESENT ILLNESS: Timothy Pruitt is a 75 y.o. male who presents to the clinic today for:     Referring physician: Theodoro Kos, MD [id number] [place],  [place] [address]  HISTORICAL INFORMATION:   Selected notes from the MEDICAL RECORD NUMBER Referred by Dr. [name] for concern of exudative ARMD OD   CURRENT MEDICATIONS: No current outpatient medications on file. (Ophthalmic Drugs)   No current facility-administered medications for this visit. (Ophthalmic Drugs)   Current Outpatient Medications (Other)  Medication Sig  . amoxicillin-clavulanate (AUGMENTIN) 875-125 MG tablet TK 1 T PO  BID WITH FOOD FOR INFECTION (Patient not taking: Reported on 05/21/2020)  . aspirin EC 81 MG tablet 1 po q day  . buPROPion (WELLBUTRIN SR) 150 MG 12 hr tablet TK 1 T PO QD  . buPROPion (WELLBUTRIN SR) 150 MG 12 hr tablet Take 150 mg by mouth once.   . busPIRone (BUSPAR) 10 MG tablet   . DOCOSAHEXAENOIC ACID PO Take by mouth.  . doxazosin (CARDURA) 4 MG tablet   . doxazosin (CARDURA) 4 MG tablet Take 4 mg by mouth daily. (Patient not taking: Reported on 01/18/2020)  . DULoxetine (CYMBALTA) 30 MG capsule Take 30 mg by mouth daily.  . Fingerstix Lancets MISC Inject into the skin.  Marland Kitchen glucose blood (ACCU-CHEK AVIVA PLUS) test strip 1 strip by external route every day  . losartan (COZAAR) 100 MG tablet TK 1 T PO QD  . losartan (COZAAR) 100 MG tablet Take 100 mg by mouth daily. (Patient not taking: Reported on 01/18/2020)  . meloxicam (MOBIC) 15 MG tablet TK 1 T PO QD  . Multiple Vitamins-Minerals (ICAPS AREDS 2 PO) Take by mouth.  . Omega-3 Fatty Acids (KP FISH OIL) 1200 MG CAPS Take by mouth.  . rosuvastatin (CRESTOR) 5 MG tablet Take by mouth.  . sildenafil (REVATIO) 20 MG tablet Take by mouth.  . tamsulosin (FLOMAX) 0.4 MG CAPS capsule Take  0.4 mg by mouth daily.   Current Facility-Administered Medications (Other)  Medication Route  . Bevacizumab (AVASTIN) SOLN 1.25 mg Intravitreal  . Bevacizumab (AVASTIN) SOLN 1.25 mg Intravitreal  . Bevacizumab (AVASTIN) SOLN 1.25 mg Intravitreal  . Bevacizumab (AVASTIN) SOLN 1.25 mg Intravitreal  . Bevacizumab (AVASTIN) SOLN 1.25 mg Intravitreal  . Bevacizumab (AVASTIN) SOLN 1.25 mg Intravitreal  . Bevacizumab (AVASTIN) SOLN 1.25 mg Intravitreal      REVIEW OF SYSTEMS:    ALLERGIES Allergies  Allergen Reactions  . Sulfa Antibiotics Hives  . Trazodone And Nefazodone Swelling    PAST MEDICAL HISTORY Past Medical History:  Diagnosis Date  . Cataract    Combined forms OU  . Diabetes (HCC)   . Macular degeneration    Wet OD, Dry OS   Past Surgical History:  Procedure Laterality Date  . APPENDECTOMY      FAMILY HISTORY Family History  Problem Relation Age of Onset  . Cataracts Mother   . Fuch's dystrophy Mother   . Glaucoma Mother   . Diabetes Brother     SOCIAL HISTORY Social History   Tobacco Use  . Smoking status: Former Games developer  . Smokeless tobacco: Never Used  Vaping Use  . Vaping Use: Never used  Substance Use Topics  . Alcohol use: Yes    Alcohol/week: 1.0 standard drink    Types: 1 Cans of beer  per week  . Drug use: Never         OPHTHALMIC EXAM:  Not recorded     IMAGING AND PROCEDURES  Imaging and Procedures for @TODAY @           ASSESSMENT/PLAN:  No diagnosis found.  1, 2. Exudative age related macular degeneration, OD  - at initial presentation, pt endorsed metamorphopsia   - OCT OD with PED and overlying SRF + drusen  - S/P IVA OD #1 (05.08.19) early resolution, #2 (06.06.19) #3 (07.03.19), #4 (08.13.19), #5 (10.08.19), #6 (12.13.19), #7 (03.12.20),#8 (07.02.20), #9 (10.20.20), #10 (02.15.21), #11 (06.16.21)  - today, pt reports stable resolution of metamorphopsia   - OCT shows persistent resolution of SRF and stable PED  at 17.5 wk interval  - BCVA stable at 20/25   - exam stable without heme  - discussed treatment options  - recommend holding IVA OD today  - pt in agreement  - f/u in 3 months, sooner prn -- DFE/OCT  3. Age related macular degeneration, non-exudative, OS  - The incidence, anatomy, and pathology of dry AMD, risk of progression, and the AREDS and AREDS 2 study including smoking risks discussed with patient.  - continue amsler grid monitoring  4. Epiretinal membrane, OS  - mild ERM  - asymptomatic, no metamorphopsia  - no indication for surgery at this time  - monitor for now  - f/u in 4 months  5. PVD / vitreous syneresis  - Discussed findings and prognosis  - No RT or RD on 360 peripheral exam  - Reviewed s/s of RT/RD  - Strict return precautions for any such RT/RD signs/symptoms  6. Combined form age-related cataract OU-   - The symptoms of cataract, surgical options, and treatments and risks were discussed with patient.  - mild progression on exam today  - mild decrease in 06.18.21 to 20/40 from 20/30  - recommend cataract eval  Ophthalmic Meds Ordered this visit:  No orders of the defined types were placed in this encounter.     No follow-ups on file.  There are no Patient Instructions on file for this visit.  This document serves as a record of services personally performed by [place], MD, PhD. It was created on their behalf by Karie Chimera, COT an ophthalmic technician. The creation of this record is the provider's dictation and/or activities during the visit.    Electronically signed by: Cristopher Estimable, COT 1.18.22 @ 9:23 AM  Abbreviations: M myopia (nearsighted); A astigmatism; H hyperopia (farsighted); P presbyopia; Mrx spectacle prescription;  CTL contact lenses; OD right eye; OS left eye; OU both eyes  XT exotropia; ET esotropia; PEK punctate epithelial keratitis; PEE punctate epithelial erosions; DES dry eye syndrome; MGD meibomian gland dysfunction; ATs  artificial tears; PFAT's preservative free artificial tears; NSC nuclear sclerotic cataract; PSC posterior subcapsular cataract; ERM epi-retinal membrane; PVD posterior vitreous detachment; RD retinal detachment; DM diabetes mellitus; DR diabetic retinopathy; NPDR non-proliferative diabetic retinopathy; PDR proliferative diabetic retinopathy; CSME clinically significant macular edema; DME diabetic macular edema; dbh dot blot hemorrhages; CWS cotton wool spot; POAG primary open angle glaucoma; C/D cup-to-disc ratio; HVF humphrey visual field; GVF goldmann visual field; OCT optical coherence tomography; IOP intraocular pressure; BRVO Branch retinal vein occlusion; CRVO central retinal vein occlusion; CRAO central retinal artery occlusion; BRAO branch retinal artery occlusion; RT retinal tear; SB scleral buckle; PPV pars plana vitrectomy; VH Vitreous hemorrhage; PRP panretinal laser photocoagulation; IVK intravitreal kenalog; VMT vitreomacular traction; MH Macular hole;  NVD neovascularization of the disc; NVE neovascularization elsewhere; AREDS age related eye disease study; ARMD age related macular degeneration; POAG primary open angle glaucoma; EBMD epithelial/anterior basement membrane dystrophy; ACIOL anterior chamber intraocular lens; IOL intraocular lens; PCIOL posterior chamber intraocular lens; Phaco/IOL phacoemulsification with intraocular lens placement; Wells photorefractive keratectomy; LASIK laser assisted in situ keratomileusis; HTN hypertension; DM diabetes mellitus; COPD chronic obstructive pulmonary disease

## 2020-08-23 NOTE — Progress Notes (Signed)
[facility] - Clinic Note  08/28/2020     CHIEF COMPLAINT Patient presents for Retina Follow Up   HISTORY OF PRESENT ILLNESS: Timothy Pruitt is a 75 y.o. male who presents to the clinic today for:   HPI    Retina Follow Up    Patient presents with  Wet AMD.  In right eye.  Duration of 3 months.  Since onset it is stable.  I, the attending physician,  performed the HPI with the patient and updated documentation appropriately.          Comments    3 month follow up Exu ARMD OD- Vision stable OU. BS 158 A1C 6.9 pt states he is prediabetic and checks BS about once a week.       Last edited by Timothy Pruitt, Timothy Copes, MD on 08/28/2020  2:08 PM. (History)    pt states no change in vision  Referring physician: Kaylyn Pruitt, Timothy Pruitt, OD [address] [place],  [place] [id number]  HISTORICAL INFORMATION:   Selected notes from the MEDICAL RECORD NUMBER Referred by Dr. Jonny RuizKenneth Pruitt for concern of exudative ARMD OD    CURRENT MEDICATIONS: No current outpatient medications on file. (Ophthalmic Drugs)   No current facility-administered medications for this visit. (Ophthalmic Drugs)   Current Outpatient Medications (Other)  Medication Sig  . aspirin EC 81 MG tablet 1 po q day  . buPROPion (WELLBUTRIN SR) 150 MG 12 hr tablet TK 1 T PO QD  . buPROPion (WELLBUTRIN SR) 150 MG 12 hr tablet Take 150 mg by mouth once.   . busPIRone (BUSPAR) 10 MG tablet   . DOCOSAHEXAENOIC ACID PO Take by mouth.  . doxazosin (CARDURA) 4 MG tablet   . DULoxetine (CYMBALTA) 30 MG capsule Take 30 mg by mouth daily.  Marland Kitchen. losartan (COZAAR) 100 MG tablet TK 1 T PO QD  . losartan (COZAAR) 100 MG tablet Take 100 mg by mouth daily.  . meloxicam (MOBIC) 15 MG tablet TK 1 T PO QD  . Multiple Vitamins-Minerals (ICAPS AREDS 2 PO) Take by mouth.  . Omega-3 Fatty Acids (KP FISH OIL) 1200 MG CAPS Take by mouth.  . rosuvastatin (CRESTOR) 5 MG tablet Take by mouth.  . sildenafil (REVATIO) 20 MG tablet Take by  mouth.  . tamsulosin (FLOMAX) 0.4 MG CAPS capsule Take 0.4 mg by mouth daily.  Marland Kitchen. amoxicillin-clavulanate (AUGMENTIN) 875-125 MG tablet TK 1 T PO  BID WITH FOOD FOR INFECTION (Patient not taking: No sig reported)  . doxazosin (CARDURA) 4 MG tablet Take 4 mg by mouth daily. (Patient not taking: Reported on 01/18/2020)  . Fingerstix Lancets MISC Inject into the skin.  Marland Kitchen. glucose blood (ACCU-CHEK AVIVA PLUS) test strip 1 strip by external route every day   Current Facility-Administered Medications (Other)  Medication Route  . Bevacizumab (AVASTIN) SOLN 1.25 mg Intravitreal  . Bevacizumab (AVASTIN) SOLN 1.25 mg Intravitreal  . Bevacizumab (AVASTIN) SOLN 1.25 mg Intravitreal  . Bevacizumab (AVASTIN) SOLN 1.25 mg Intravitreal  . Bevacizumab (AVASTIN) SOLN 1.25 mg Intravitreal  . Bevacizumab (AVASTIN) SOLN 1.25 mg Intravitreal  . Bevacizumab (AVASTIN) SOLN 1.25 mg Intravitreal      REVIEW OF SYSTEMS: ROS    Positive for: Genitourinary, Endocrine, Eyes   Negative for: Constitutional, Gastrointestinal, Neurological, Skin, Musculoskeletal, HENT, Cardiovascular, Respiratory, Psychiatric, Allergic/Imm, Heme/Lymph   Last edited by Timothy Pruitt, Timothy Pruitt, COA on 08/28/2020  1:46 PM. (History)       ALLERGIES Allergies  Allergen Reactions  . Sulfa Antibiotics Hives  .  Trazodone And Nefazodone Swelling    PAST MEDICAL HISTORY Past Medical History:  Diagnosis Date  . Cataract    Combined forms OU  . Diabetes (HCC)   . Macular degeneration    Wet OD, Dry OS   Past Surgical History:  Procedure Laterality Date  . APPENDECTOMY      FAMILY HISTORY Family History  Problem Relation Age of Onset  . Cataracts Mother   . Fuch's dystrophy Mother   . Glaucoma Mother   . Diabetes Brother     SOCIAL HISTORY Social History   Tobacco Use  . Smoking status: Former Games developer  . Smokeless tobacco: Never Used  Vaping Use  . Vaping Use: Never used  Substance Use Topics  . Alcohol use: Yes     Alcohol/week: 1.0 standard drink    Types: 1 Cans of beer per week  . Drug use: Never         OPHTHALMIC EXAM:  Base Eye Exam    Visual Acuity (Snellen - Linear)      Right Left   Dist cc 20/25 -1 20/40 -2   Dist ph cc  NI       Tonometry (Tonopen, 1:52 PM)      Right Left   Pressure 15 14       Pupils      Dark Light Shape React APD   Right 4 3 Round Brisk None   Left 4 3 Round Brisk None       Visual Fields (Counting fingers)      Left Right    Full Full       Extraocular Movement      Right Left    Full Full       Neuro/Psych    Oriented x3: Yes   Mood/Affect: Normal       Dilation    Both eyes: 1.0% Mydriacyl, 2.5% Phenylephrine @ 1:52 PM        Slit Lamp and Fundus Exam    Slit Lamp Exam      Right Left   Lids/Lashes Dermatochalasis - upper lid Dermatochalasis - upper lid   Conjunctiva/Sclera White and quiet White and quiet   Cornea Arcus, trace tear film debris Arcus, trace tear film debris, trace Punctate epithelial erosions   Anterior Chamber Deep and quiet Deep and quiet   Iris Round and dilated Round and dilated   Lens 2-3+ Nuclear sclerosis with early brunescence, 2-3+ Cortical cataract 2+ Nuclear sclerosis with brunescence, 3+ Cortical cataract   Vitreous Vitreous syneresis, Posterior vitreous detachment, vitreous condensations Vitreous syneresis       Fundus Exam      Right Left   Disc Compact, mild Temporal Peripapillary atrophy, Pink and Sharp Pink and Sharp, mild Temporal Peripapillary atrophy, Compact   C/D Ratio 0.1 0.1   Macula Blunted foveal reflex, central PED, central pigment clumping, RPE mottling and clumping, Drusen, no heme Blunted foveal reflex, Epiretinal membrane, RPE mottling /  Clumping / atophy, Drusen, No heme or edema, patch of RPE atrophy IT macula, stable   Vessels attenuated, mild tortuousity, mild AV crossing changes attenuated, mild tortuousity   Periphery Attached, scattered RPE changes, no heme, peripheral  drusen, mild reticular degeneration Attached, scattered peripheral drusen           IMAGING AND PROCEDURES  Imaging and Procedures for @TODAY @  OCT, Retina - OU - Both Eyes       Right Eye Quality was good. Central Foveal Thickness: 330. Progression  has been stable. Findings include abnormal foveal contour, no IRF, pigment epithelial detachment, retinal drusen , no SRF, epiretinal membrane, vitreomacular adhesion , myopic contour (Stable central PED w/ stable improvement in overlying ellipsoid signal).   Left Eye Quality was good. Central Foveal Thickness: 268. Progression has been stable. Findings include normal foveal contour, epiretinal membrane, retinal drusen , pigment epithelial detachment, outer retinal atrophy, no SRF, no IRF, inner retinal atrophy (Persistent ERM; Stable patch of ORA and overlying atrophy IT macula).   Notes *Images captured and stored on drive  Diagnosis / Impression:  OD: Exudative ARMD - no IRF/SRF; stable central PED OS: Non- Exudative ARMD with early atrophy; ERM -- persistent; Stable patch of ORA and overlying atrophy IT macula   Clinical management:  See below  Abbreviations: NFP - Normal foveal profile. CME - cystoid macular edema. PED - pigment epithelial detachment. IRF - intraretinal fluid. SRF - subretinal fluid. EZ - ellipsoid zone. ERM - epiretinal membrane. ORA - outer retinal atrophy. ORT - outer retinal tubulation. SRHM - subretinal hyper-reflective material                  ASSESSMENT/PLAN:    ICD-10-CM   1. Exudative age-related macular degeneration of right eye with active choroidal neovascularization (HCC)  H35.3211   2. Retinal edema  H35.81 OCT, Retina - OU - Both Eyes  3. Intermediate stage nonexudative age-related macular degeneration of left eye  H35.3122   4. Epiretinal membrane (ERM) of left eye  H35.372   5. Posterior vitreous detachment of right eye  H43.811   6. Combined forms of age-related cataract of both  eyes  H25.813     1, 2. Exudative age related macular degeneration, OD  - at initial presentation, pt endorsed metamorphopsia   - OCT OD with PED and overlying SRF + drusen  - S/P IVA OD #1 (05.08.19) early resolution, #2 (06.06.19) #3 (07.03.19), #4 (08.13.19), #5 (10.08.19), #6 (12.13.19), #7 (03.12.20),#8 (07.02.20), #9 (10.20.20), #10 (02.15.21), #11 (06.16.21)  - today, pt reports stable resolution of metamorphopsia   - OCT shows stable resolution of SRF and stable PED despite no IVA since 6.16.21  - BCVA stable at 20/25   - exam stable without heme  - recommend holding IVA OD today  - pt in agreement  - f/u in 4-6 months, sooner prn -- DFE/OCT  3. Age related macular degeneration, non-exudative, OS  - The incidence, anatomy, and pathology of dry AMD, risk of progression, and the AREDS and AREDS 2 study including smoking risks discussed with patient.  - continue amsler grid monitoring  4. Epiretinal membrane, OS  - mild ERM  - asymptomatic, no metamorphopsia  - no indication for surgery at this time  - monitor for now  - f/u in 4 months  5. PVD / vitreous syneresis  - Discussed findings and prognosis  - No RT or RD on 360 peripheral exam  - Reviewed s/s of RT/RD  - Strict return precautions for any such RT/RD signs/symptoms  6. Combined form age-related cataract OU-   - The symptoms of cataract, surgical options, and treatments and risks were discussed with patient.  - mild progression on exam today  - mild decrease in [place] to 20/40 from 20/30 OS  - recommend cataract eval -- pt to return to Dr. Si Gaul for cataract management / referral  Ophthalmic Meds Ordered this visit:  No orders of the defined types were placed in this encounter.      Return for  f/u 4-6 months exu ARMD OD, DFE, OCT.  There are no Patient Instructions on file for this visit.  This document serves as a record of services personally performed by Karie Chimera, MD, PhD. It was created on  their behalf by Herby Abraham, COA, an ophthalmic technician. The creation of this record is the provider's dictation and/or activities during the visit.    Electronically signed by: Herby Abraham, COA 01.20.2022 11:44 PM   This document serves as a record of services personally performed by Karie Chimera, MD, PhD. It was created on their behalf by Glee Arvin. Manson Passey, OA an ophthalmic technician. The creation of this record is the provider's dictation and/or activities during the visit.    Electronically signed by: Glee Arvin. Manson Passey, [place] 01.25.2022 11:44 PM  Karie Chimera, M.D., Ph.D. Diseases & Surgery of the Retina and Vitreous [facility] [place] 08/28/2020   I have reviewed the above documentation for accuracy and completeness, and I agree with the above. Karie Chimera, M.D., Ph.D. 08/28/20 11:46 PM    Abbreviations: M myopia (nearsighted); A astigmatism; H hyperopia (farsighted); P presbyopia; Mrx spectacle prescription;  CTL contact lenses; OD right eye; OS left eye; OU both eyes  XT exotropia; ET esotropia; PEK punctate epithelial keratitis; PEE punctate epithelial erosions; DES dry eye syndrome; MGD meibomian gland dysfunction; ATs artificial tears; PFAT's preservative free artificial tears; NSC nuclear sclerotic cataract; PSC posterior subcapsular cataract; ERM epi-retinal membrane; PVD posterior vitreous detachment; RD retinal detachment; DM diabetes mellitus; DR diabetic retinopathy; NPDR non-proliferative diabetic retinopathy; PDR proliferative diabetic retinopathy; CSME clinically significant macular edema; DME diabetic macular edema; dbh dot blot hemorrhages; CWS cotton wool spot; POAG primary open angle glaucoma; C/D cup-to-disc ratio; HVF humphrey visual field; GVF goldmann visual field; OCT optical coherence tomography; IOP intraocular pressure; BRVO Branch retinal vein occlusion; CRVO central retinal vein occlusion; CRAO central retinal artery occlusion; BRAO  branch retinal artery occlusion; RT retinal tear; SB scleral buckle; PPV pars plana vitrectomy; VH Vitreous hemorrhage; PRP panretinal laser photocoagulation; IVK intravitreal kenalog; VMT vitreomacular traction; MH Macular hole;  NVD neovascularization of the disc; NVE neovascularization elsewhere; AREDS age related eye disease study; ARMD age related macular degeneration; POAG primary open angle glaucoma; EBMD epithelial/anterior basement membrane dystrophy; ACIOL anterior chamber intraocular lens; IOL intraocular lens; PCIOL posterior chamber intraocular lens; Phaco/IOL phacoemulsification with intraocular lens placement; PRK photorefractive keratectomy; LASIK laser assisted in situ keratomileusis; HTN hypertension; DM diabetes mellitus; COPD chronic obstructive pulmonary disease

## 2020-08-28 ENCOUNTER — Encounter (INDEPENDENT_AMBULATORY_CARE_PROVIDER_SITE_OTHER): Payer: Self-pay | Admitting: Ophthalmology

## 2020-08-28 ENCOUNTER — Ambulatory Visit (INDEPENDENT_AMBULATORY_CARE_PROVIDER_SITE_OTHER): Payer: Medicare Other | Admitting: Ophthalmology

## 2020-08-28 ENCOUNTER — Other Ambulatory Visit: Payer: Self-pay

## 2020-08-28 DIAGNOSIS — H353211 Exudative age-related macular degeneration, right eye, with active choroidal neovascularization: Secondary | ICD-10-CM

## 2020-08-28 DIAGNOSIS — H35372 Puckering of macula, left eye: Secondary | ICD-10-CM

## 2020-08-28 DIAGNOSIS — H3581 Retinal edema: Secondary | ICD-10-CM

## 2020-08-28 DIAGNOSIS — H43811 Vitreous degeneration, right eye: Secondary | ICD-10-CM

## 2020-08-28 DIAGNOSIS — H25813 Combined forms of age-related cataract, bilateral: Secondary | ICD-10-CM

## 2020-08-28 DIAGNOSIS — H353122 Nonexudative age-related macular degeneration, left eye, intermediate dry stage: Secondary | ICD-10-CM

## 2020-09-11 ENCOUNTER — Telehealth: Payer: Self-pay

## 2020-09-11 NOTE — Telephone Encounter (Signed)
NOTES ON FILE FROM [facility] [phone], SENT REFERRAL TO SCHEDULING

## 2020-09-12 ENCOUNTER — Encounter: Payer: Self-pay | Admitting: Cardiology

## 2020-09-12 NOTE — H&P (View-Only) (Signed)
  Cardiology Office Note  Date: 09/13/2020   ID: Timothy Pruitt, DOB 11/26/1945, MRN [id number]  PCP:  Lewis, William B, MD  Cardiologist:  Jaymison Luber, MD Electrophysiologist:  None   Chief Complaint  Patient presents with  . History of chest pain  . Shortness of Breath    History of Present Illness: Timothy Pruitt is a 75 y.o. male referred for cardiology consultation by Dr. Lewis with the [facility] due to abnormal myocardial perfusion study.  He is here today with his wife.  He tells me that back in October while working outdoors he experienced an episode of chest tightness and shortness of breath.  Since that time he has had fatigue and shortness of breath with activity, no definite chest tightness, no palpitations or syncope.  He has been somewhat unsteady at times.  He has a history of hypertension, also diet managed type 2 diabetes mellitus as well as hyperlipidemia with multi statin intolerance.  I reviewed his most recent lab work, in November 2021 LDL was 122 and triglycerides 203.  He has tried to modify his diet.  Lexiscan Myoview from January at the [facility] is outlined below. Reportedly, patient also underwent carotid studies that were negative for obstructive disease.  We discussed the results of his Lexiscan Myoview which raises the possibility of distal RCA distribution scar.  With continued exertional symptoms, we discussed the risks and benefits of a diagnostic cardiac catheterization to better define coronary anatomy and help guide treatment options.  He is in agreement to proceed.  I reviewed his medications which are listed below.  ECG today shows normal sinus rhythm.  Past Medical History:  Diagnosis Date  . Aortic atherosclerosis (HCC)    CT imaging  . Cataract    Combined forms OU  . Depression   . Essential hypertension   . Macular degeneration    Wet OD, Dry OS  . Mixed hyperlipidemia   . Nephrolithiasis    Status post lithotripsy  . Right  arm fracture    Age 12  . Statin intolerance   . Type 2 diabetes mellitus (HCC)     Past Surgical History:  Procedure Laterality Date  . APPENDECTOMY    . COLONOSCOPY    . Skin cancer resection    . TONSILLECTOMY      Current Outpatient Medications  Medication Sig Dispense Refill  . aspirin EC 81 MG tablet 1 po q day    . busPIRone (BUSPAR) 15 MG tablet Take 15 mg by mouth 3 (three) times daily.    . doxazosin (CARDURA) 4 MG tablet Take 4 mg by mouth daily.    . DULoxetine (CYMBALTA) 30 MG capsule Take 30 mg by mouth daily.    . Fingerstix Lancets MISC Inject into the skin.    . glucose blood (ACCU-CHEK AVIVA PLUS) test strip 1 strip by external route every day    . losartan (COZAAR) 100 MG tablet TK 1 T PO QD  2  . metoprolol succinate (TOPROL-XL) 25 MG 24 hr tablet Take 25 mg by mouth every morning.    . Omega-3 Fatty Acids (KP FISH OIL) 1200 MG CAPS Take by mouth.    . sildenafil (REVATIO) 20 MG tablet Take by mouth.    . tamsulosin (FLOMAX) 0.4 MG CAPS capsule Take 0.4 mg by mouth daily.     Current Facility-Administered Medications  Medication Dose Route Frequency Provider Last Rate Last Admin  . Bevacizumab (AVASTIN) SOLN 1.25 mg  1.25 mg   Intravitreal  Rennis Chris, MD   1.25 mg at 12/12/17 1452  . Bevacizumab (AVASTIN) SOLN 1.25 mg  1.25 mg Intravitreal  Rennis Chris, MD   1.25 mg at 01/07/18 1109  . Bevacizumab (AVASTIN) SOLN 1.25 mg  1.25 mg Intravitreal  Rennis Chris, MD   1.25 mg at 02/03/18 1141  . Bevacizumab (AVASTIN) SOLN 1.25 mg  1.25 mg Intravitreal  Rennis Chris, MD   1.25 mg at 03/16/18 1219  . Bevacizumab (AVASTIN) SOLN 1.25 mg  1.25 mg Intravitreal  Rennis Chris, MD   1.25 mg at 05/11/18 1309  . Bevacizumab (AVASTIN) SOLN 1.25 mg  1.25 mg Intravitreal  Rennis Chris, MD   1.25 mg at 07/19/18 1357  . Bevacizumab (AVASTIN) SOLN 1.25 mg  1.25 mg Intravitreal  Rennis Chris, MD   1.25 mg at 10/15/18 1652   Allergies:  Statins, Sulfa antibiotics, and  Trazodone and nefazodone   Social History: The patient  reports that he has quit smoking. His smoking use included cigarettes. He has never used smokeless tobacco. He reports current alcohol use. He reports that he does not use drugs.   Family History: The patient's family history includes Arthritis in his father; CAD in his father; Cancer in his mother; Cataracts in his mother; Diabetes in his brother; Fuch's dystrophy in his mother; Glaucoma in his mother; Stroke in his mother.   ROS: No palpitations or syncope.  Physical Exam: VS:  BP (!) 162/80 (BP Location: Right Arm)   Pulse 70   Ht 5\' 8"  (1.727 m)   Wt 177 lb (80.3 kg)   SpO2 97%   BMI 26.91 kg/m , BMI Body mass index is 26.91 kg/m.  Wt Readings from Last 3 Encounters:  09/13/20 177 lb (80.3 kg)    General: Patient appears comfortable at rest. HEENT: Conjunctiva and lids normal, wearing a mask. Neck: Supple, no elevated JVP or carotid bruits. Lungs: Clear to auscultation, nonlabored breathing at rest. Cardiac: Regular rate and rhythm, no S3 or significant systolic murmur, no pericardial rub. Abdomen: Soft, bowel sounds present. Extremities: No pitting edema, distal pulses 2+. Skin: Warm and dry. Musculoskeletal: No kyphosis. Neuropsychiatric: Alert and oriented x3, affect grossly appropriate.  ECG:  No prior tracings available for review.  Recent Labwork:  November 2021: Cholesterol 202, triglycerides 203, LDL 122, HDL 40, TSH 2.57 December 2021: Hemoglobin 13.3, platelets 221, BUN 18, creatinine 1.12, potassium 4, AST 25, ALT 37  Other Studies Reviewed Today:  Lexiscan Myoview 08/07/2020 [facility]): Impression:   Mildly abnormal pharmacologic myocardial perfusion study.  1. 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   2. Transient ischemic dilatation (TID): 1.12(normal)  3. Low normal left ventricular systolic function; LVEF : 53%, SV : 33 mL.  No regional wall motion abnormalities.  4. No significant EKG changes with stress. Sinus rhythm at rest.  5. Low risk study.   6. No prior studies to compare  7. No extracardiac uptake   Assessment and Plan:  1.  History of chest discomfort and subsequent dyspnea on exertion in a 75 year old male with diet managed type 2 diabetes mellitus, mixed hyperlipidemia with statin intolerance, hypertension, and family history of heart disease.  He had a Lexiscan Myoview through the [facility] in January that was low risk, indicated possible distal RCA distribution scar with normal LVEF,  however with continued exertional symptoms and risk factor profile, cardiac catheterization has been discussed.  We reviewed the risks and benefits and he is in agreement to proceed.  For now would continue aspirin, losartan, and Toprol-XL.  2.  Mixed hyperlipidemia with multi-statin intolerance.  Reports prior trouble with myalgias and fatigue on several preparations.  I did talk with him about considering PCSK9 inhibitor as a next option.  3.  Reportedly diet managed type 2 diabetes mellitus.  He follows with Dr. Melvyn Neth.  May be a good candidate for SGLT2 inhibitor.  4.  Essential hypertension, currently on Cozaar and Toprol-XL.  Blood pressure is elevated today, may need further medication titration.  Medication Adjustments/Labs and Tests Ordered: Current medicines are reviewed at length with the patient today.  Concerns regarding medicines are outlined above.   Tests Ordered: Orders Placed This Encounter  Procedures  . Basic Metabolic Panel (BMET)  . CBC  . EKG 12-Lead    Medication Changes: No orders of the defined types were placed in this encounter.   Disposition:  Follow up after procedure.  Signed, Jonelle Sidle, MD,  [facility] 09/13/2020 9:45 AM    [facility] [address] [place], [place], [place] [address] Phone: [phone]; Fax: [phone]

## 2020-09-12 NOTE — Progress Notes (Signed)
Cardiology Office Note  Date: 09/13/2020   ID: Mikey, Maffett 1946-07-20, MRN [id number]  PCP:  Theodoro Kos, MD  Cardiologist:  Nona Dell, MD Electrophysiologist:  None   Chief Complaint  Patient presents with  . History of chest pain  . Shortness of Breath    History of Present Illness: Timothy Pruitt is a 75 y.o. male referred for cardiology consultation by Dr. Melvyn Neth with the [facility] due to abnormal myocardial perfusion study.  He is here today with his wife.  He tells me that back in October while working outdoors he experienced an episode of chest tightness and shortness of breath.  Since that time he has had fatigue and shortness of breath with activity, no definite chest tightness, no palpitations or syncope.  He has been somewhat unsteady at times.  He has a history of hypertension, also diet managed type 2 diabetes mellitus as well as hyperlipidemia with multi statin intolerance.  I reviewed his most recent lab work, in November 2021 LDL was 122 and triglycerides 203.  He has tried to modify his diet.  Lexiscan Myoview from January at the [facility] is outlined below. Reportedly, patient also underwent carotid studies that were negative for obstructive disease.  We discussed the results of his Lexiscan Myoview which raises the possibility of distal RCA distribution scar.  With continued exertional symptoms, we discussed the risks and benefits of a diagnostic cardiac catheterization to better define coronary anatomy and help guide treatment options.  He is in agreement to proceed.  I reviewed his medications which are listed below.  ECG today shows normal sinus rhythm.  Past Medical History:  Diagnosis Date  . Aortic atherosclerosis (HCC)    CT imaging  . Cataract    Combined forms OU  . Depression   . Essential hypertension   . Macular degeneration    Wet OD, Dry OS  . Mixed hyperlipidemia   . Nephrolithiasis    Status post lithotripsy  . Right  arm fracture    Age 75  . Statin intolerance   . Type 2 diabetes mellitus (HCC)     Past Surgical History:  Procedure Laterality Date  . APPENDECTOMY    . COLONOSCOPY    . Skin cancer resection    . TONSILLECTOMY      Current Outpatient Medications  Medication Sig Dispense Refill  . aspirin EC 81 MG tablet 1 po q day    . busPIRone (BUSPAR) 15 MG tablet Take 15 mg by mouth 3 (three) times daily.    Marland Kitchen doxazosin (CARDURA) 4 MG tablet Take 4 mg by mouth daily.    . DULoxetine (CYMBALTA) 30 MG capsule Take 30 mg by mouth daily.    . Fingerstix Lancets MISC Inject into the skin.    Marland Kitchen glucose blood (ACCU-CHEK AVIVA PLUS) test strip 1 strip by external route every day    . losartan (COZAAR) 100 MG tablet TK 1 T PO QD  2  . metoprolol succinate (TOPROL-XL) 25 MG 24 hr tablet Take 25 mg by mouth every morning.    . Omega-3 Fatty Acids (KP FISH OIL) 1200 MG CAPS Take by mouth.    . sildenafil (REVATIO) 20 MG tablet Take by mouth.    . tamsulosin (FLOMAX) 0.4 MG CAPS capsule Take 0.4 mg by mouth daily.     Current Facility-Administered Medications  Medication Dose Route Frequency Provider Last Rate Last Admin  . Bevacizumab (AVASTIN) SOLN 1.25 mg  1.25 mg  Intravitreal  Rennis Chris, MD   1.25 mg at 12/12/17 1452  . Bevacizumab (AVASTIN) SOLN 1.25 mg  1.25 mg Intravitreal  Rennis Chris, MD   1.25 mg at 01/07/18 1109  . Bevacizumab (AVASTIN) SOLN 1.25 mg  1.25 mg Intravitreal  Rennis Chris, MD   1.25 mg at 02/03/18 1141  . Bevacizumab (AVASTIN) SOLN 1.25 mg  1.25 mg Intravitreal  Rennis Chris, MD   1.25 mg at 03/16/18 1219  . Bevacizumab (AVASTIN) SOLN 1.25 mg  1.25 mg Intravitreal  Rennis Chris, MD   1.25 mg at 05/11/18 1309  . Bevacizumab (AVASTIN) SOLN 1.25 mg  1.25 mg Intravitreal  Rennis Chris, MD   1.25 mg at 07/19/18 1357  . Bevacizumab (AVASTIN) SOLN 1.25 mg  1.25 mg Intravitreal  Rennis Chris, MD   1.25 mg at 10/15/18 1652   Allergies:  Statins, Sulfa antibiotics, and  Trazodone and nefazodone   Social History: The patient  reports that he has quit smoking. His smoking use included cigarettes. He has never used smokeless tobacco. He reports current alcohol use. He reports that he does not use drugs.   Family History: The patient's family history includes Arthritis in his father; CAD in his father; Cancer in his mother; Cataracts in his mother; Diabetes in his brother; Fuch's dystrophy in his mother; Glaucoma in his mother; Stroke in his mother.   ROS: No palpitations or syncope.  Physical Exam: VS:  BP (!) 162/80 (BP Location: Right Arm)   Pulse 70   Ht 5\' 8"  (1.727 m)   Wt 177 lb (80.3 kg)   SpO2 97%   BMI 26.91 kg/m , BMI Body mass index is 26.91 kg/m.  Wt Readings from Last 3 Encounters:  09/13/20 177 lb (80.3 kg)    General: Patient appears comfortable at rest. HEENT: Conjunctiva and lids normal, wearing a mask. Neck: Supple, no elevated JVP or carotid bruits. Lungs: Clear to auscultation, nonlabored breathing at rest. Cardiac: Regular rate and rhythm, no S3 or significant systolic murmur, no pericardial rub. Abdomen: Soft, bowel sounds present. Extremities: No pitting edema, distal pulses 2+. Skin: Warm and dry. Musculoskeletal: No kyphosis. Neuropsychiatric: Alert and oriented x3, affect grossly appropriate.  ECG:  No prior tracings available for review.  Recent Labwork:  November 2021: Cholesterol 202, triglycerides 203, LDL 122, HDL 40, TSH 2.57 December 2021: Hemoglobin 13.3, platelets 221, BUN 18, creatinine 1.12, potassium 4, AST 25, ALT 37  Other Studies Reviewed Today:  Lexiscan Myoview 08/07/2020 [facility]): Impression:   Mildly abnormal pharmacologic myocardial perfusion study.  1. 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   2. Transient ischemic dilatation (TID): 1.12(normal)  3. Low normal left ventricular systolic function; LVEF : 53%, SV : 33 mL.  No regional wall motion abnormalities.  4. No significant EKG changes with stress. Sinus rhythm at rest.  5. Low risk study.   6. No prior studies to compare  7. No extracardiac uptake   Assessment and Plan:  1.  History of chest discomfort and subsequent dyspnea on exertion in a 75 year old male with diet managed type 2 diabetes mellitus, mixed hyperlipidemia with statin intolerance, hypertension, and family history of heart disease.  He had a Lexiscan Myoview through the [facility] in January that was low risk, indicated possible distal RCA distribution scar with normal LVEF,  however with continued exertional symptoms and risk factor profile, cardiac catheterization has been discussed.  We reviewed the risks and benefits and he is in agreement to proceed.  For now would continue aspirin, losartan, and Toprol-XL.  2.  Mixed hyperlipidemia with multi-statin intolerance.  Reports prior trouble with myalgias and fatigue on several preparations.  I did talk with him about considering PCSK9 inhibitor as a next option.  3.  Reportedly diet managed type 2 diabetes mellitus.  He follows with Dr. Melvyn Neth.  May be a good candidate for SGLT2 inhibitor.  4.  Essential hypertension, currently on Cozaar and Toprol-XL.  Blood pressure is elevated today, may need further medication titration.  Medication Adjustments/Labs and Tests Ordered: Current medicines are reviewed at length with the patient today.  Concerns regarding medicines are outlined above.   Tests Ordered: Orders Placed This Encounter  Procedures  . Basic Metabolic Panel (BMET)  . CBC  . EKG 12-Lead    Medication Changes: No orders of the defined types were placed in this encounter.   Disposition:  Follow up after procedure.  Signed, Jonelle Sidle, MD,  [facility] 09/13/2020 9:45 AM    [facility] at [facility] [address] [place], [place], [place] [address] Phone: [phone]; Fax: [phone]

## 2020-09-13 ENCOUNTER — Telehealth: Payer: Self-pay | Admitting: Cardiology

## 2020-09-13 ENCOUNTER — Ambulatory Visit (INDEPENDENT_AMBULATORY_CARE_PROVIDER_SITE_OTHER): Payer: Medicare Other | Admitting: Cardiology

## 2020-09-13 ENCOUNTER — Encounter: Payer: Self-pay | Admitting: Cardiology

## 2020-09-13 ENCOUNTER — Other Ambulatory Visit: Payer: Self-pay | Admitting: Cardiology

## 2020-09-13 VITALS — BP 162/80 | HR 70 | Ht 68.0 in | Wt 177.0 lb

## 2020-09-13 DIAGNOSIS — R9439 Abnormal result of other cardiovascular function study: Secondary | ICD-10-CM | POA: Diagnosis not present

## 2020-09-13 DIAGNOSIS — Z87898 Personal history of other specified conditions: Secondary | ICD-10-CM | POA: Diagnosis not present

## 2020-09-13 DIAGNOSIS — R0609 Other forms of dyspnea: Secondary | ICD-10-CM

## 2020-09-13 DIAGNOSIS — E119 Type 2 diabetes mellitus without complications: Secondary | ICD-10-CM

## 2020-09-13 DIAGNOSIS — Z789 Other specified health status: Secondary | ICD-10-CM

## 2020-09-13 DIAGNOSIS — I10 Essential (primary) hypertension: Secondary | ICD-10-CM

## 2020-09-13 DIAGNOSIS — R0600 Dyspnea, unspecified: Secondary | ICD-10-CM | POA: Diagnosis not present

## 2020-09-13 DIAGNOSIS — E782 Mixed hyperlipidemia: Secondary | ICD-10-CM

## 2020-09-13 MED ORDER — SODIUM CHLORIDE 0.9% FLUSH: 3.0000 mL | Freq: Two times a day (BID) | INTRAVENOUS | Status: DC

## 2020-09-13 NOTE — Telephone Encounter (Signed)
Pre-cert Verification for the following procedure    [facility] 2/17 WITH DR [place]

## 2020-09-13 NOTE — Patient Instructions (Signed)
Your physician recommends that you schedule a follow-up appointment in: 1 MONTH WITH DR MCDOWELL OR EXTENDER  Your physician recommends that you continue on your current medications as directed. Please refer to the Current Medication list given to you today.     [facility] CARDIOVASCULAR DIVISION [facility] [address] [address] [place] [address] Dept: [phone] Loc: [phone]  Timothy Pruitt  09/13/2020  You are scheduled for a Cardiac Catheterization on Thursday, February 17 with Dr. Peter Swaz[place].  1. Please arrive at the [facility] (Main Entrance A) at [facility]: [address] [place], [place] [address] at 8:30 AM (This time is two hours before your procedure to ensure your preparation). Free valet parking service is available.   Special note: Every effort is made to have your procedure done on time. Please understand that emergencies sometimes delay scheduled procedures.  2. Diet: Do not eat solid foods after midnight.  The patient may have clear liquids until 5am upon the day of the procedure.  3. Labs: You will need to have blood drawn PRIOR TO 09/20/2020. You do not need to be fasting.  4. Medication instructions in preparation for your procedure:   On the morning of your procedure, take your Aspirin and any morning medicines NOT listed above.  You may use sips of water.  5. Plan for one night stay--bring personal belongings. 6. Bring a current list of your medications and current insurance cards. 7. You MUST have a responsible person to drive you home. 8. Someone MUST be with you the first 24 hours after you arrive home or your discharge will be delayed. 9. Please wear clothes that are easy to get on and off and wear slip-on shoes.  Thank you for allowing [place] to care for you!   -- [facility]

## 2020-09-18 ENCOUNTER — Other Ambulatory Visit (HOSPITAL_COMMUNITY)
Admission: RE | Admit: 2020-09-18 | Discharge: 2020-09-18 | Disposition: A | Discharge status: Home / Self Care | Payer: Medicare Other | Source: Ambulatory Visit | Attending: Cardiology | Admitting: Cardiology

## 2020-09-18 ENCOUNTER — Telehealth: Payer: Self-pay | Admitting: *Deleted

## 2020-09-18 ENCOUNTER — Other Ambulatory Visit: Payer: Self-pay

## 2020-09-18 DIAGNOSIS — Z20822 Contact with and (suspected) exposure to covid-19: Secondary | ICD-10-CM | POA: Insufficient documentation

## 2020-09-18 DIAGNOSIS — Z01812 Encounter for preprocedural laboratory examination: Secondary | ICD-10-CM | POA: Insufficient documentation

## 2020-09-18 DIAGNOSIS — R9439 Abnormal result of other cardiovascular function study: Secondary | ICD-10-CM | POA: Insufficient documentation

## 2020-09-18 LAB — BASIC METABOLIC PANEL
Anion gap: 8 (ref 5–15)
BUN: 16 mg/dL (ref 8–23)
CO2: 27 mmol/L (ref 22–32)
Calcium: 9.2 mg/dL (ref 8.9–10.3)
Chloride: 103 mmol/L (ref 98–111)
Creatinine, Ser: 1 mg/dL (ref 0.61–1.24)
GFR, Estimated: >60 mL/min (ref >60)
Glucose, Bld: 178 mg/dL — ABNORMAL HIGH (ref 70–99)
Potassium: 4.1 mmol/L (ref 3.5–5.1)
Sodium: 138 mmol/L (ref 135–145)

## 2020-09-18 LAB — CBC
HCT: 40.9 % (ref 39.0–52.0)
Hemoglobin: 13.8 g/dL (ref 13.0–17.0)
MCH: 29.6 pg (ref 26.0–34.0)
MCHC: 33.7 g/dL (ref 30.0–36.0)
MCV: 87.8 fL (ref 80.0–100.0)
Platelets: 192 10*3/uL (ref 150–400)
RBC: 4.66 MIL/uL (ref 4.22–5.81)
RDW: 12.7 % (ref 11.5–15.5)
WBC: 5.8 10*3/uL (ref 4.0–10.5)
nRBC: 0 % (ref 0.0–0.2)

## 2020-09-18 LAB — SARS CORONAVIRUS 2 (TAT 6-24 HRS): SARS Coronavirus 2: NEGATIVE

## 2020-09-18 NOTE — Telephone Encounter (Signed)
Pt contacted pre-catheterization scheduled at [facility] for: Thursday September 20, 2020 10:30 AM Verified arrival time and place: [facility] Main Entrance A [facility]) at: 8:30 AM   No solid food after midnight prior to cath, clear liquids until 5 AM day of procedure.  Hold: Sildenafil-until post procedure  AM meds can be  taken pre-cath with sips of water including: ASA 81 mg   Confirmed patient has responsible adult to drive home post procedure and be with patient first 24 hours after arriving home: yes  You are allowed ONE visitor in the waiting room during the time you are at the hospital for your procedure. Both you and your visitor must wear a mask once you enter the hospital.   Reviewed procedure/mask/visitor instructions with patient.

## 2020-09-20 ENCOUNTER — Ambulatory Visit (HOSPITAL_COMMUNITY)
Admission: RE | Admit: 2020-09-20 | Discharge: 2020-09-20 | Disposition: A | Discharge status: Home / Self Care | Payer: Medicare Other | Source: Ambulatory Visit | Attending: Cardiology | Admitting: Cardiology

## 2020-09-20 ENCOUNTER — Encounter (HOSPITAL_COMMUNITY): Payer: Self-pay | Admitting: Interventional Cardiology

## 2020-09-20 ENCOUNTER — Encounter (HOSPITAL_COMMUNITY)
Admission: RE | Disposition: A | Discharge status: Home / Self Care | Payer: Self-pay | Source: Ambulatory Visit | Attending: Cardiology

## 2020-09-20 ENCOUNTER — Other Ambulatory Visit: Payer: Self-pay

## 2020-09-20 DIAGNOSIS — Z87891 Personal history of nicotine dependence: Secondary | ICD-10-CM | POA: Insufficient documentation

## 2020-09-20 DIAGNOSIS — Z7982 Long term (current) use of aspirin: Secondary | ICD-10-CM | POA: Insufficient documentation

## 2020-09-20 DIAGNOSIS — R9439 Abnormal result of other cardiovascular function study: Secondary | ICD-10-CM | POA: Diagnosis not present

## 2020-09-20 DIAGNOSIS — Z888 Allergy status to other drugs, medicaments and biological substances status: Secondary | ICD-10-CM | POA: Insufficient documentation

## 2020-09-20 DIAGNOSIS — Z8249 Family history of ischemic heart disease and other diseases of the circulatory system: Secondary | ICD-10-CM | POA: Diagnosis not present

## 2020-09-20 DIAGNOSIS — I2510 Atherosclerotic heart disease of native coronary artery without angina pectoris: Secondary | ICD-10-CM | POA: Diagnosis not present

## 2020-09-20 DIAGNOSIS — R0789 Other chest pain: Secondary | ICD-10-CM | POA: Diagnosis not present

## 2020-09-20 DIAGNOSIS — I10 Essential (primary) hypertension: Secondary | ICD-10-CM | POA: Diagnosis not present

## 2020-09-20 DIAGNOSIS — Z79899 Other long term (current) drug therapy: Secondary | ICD-10-CM | POA: Insufficient documentation

## 2020-09-20 DIAGNOSIS — E119 Type 2 diabetes mellitus without complications: Secondary | ICD-10-CM | POA: Insufficient documentation

## 2020-09-20 DIAGNOSIS — E782 Mixed hyperlipidemia: Secondary | ICD-10-CM | POA: Insufficient documentation

## 2020-09-20 DIAGNOSIS — R0609 Other forms of dyspnea: Secondary | ICD-10-CM | POA: Insufficient documentation

## 2020-09-20 DIAGNOSIS — Z882 Allergy status to sulfonamides status: Secondary | ICD-10-CM | POA: Diagnosis not present

## 2020-09-20 HISTORY — PX: LEFT HEART CATH AND CORONARY ANGIOGRAPHY: CATH118249

## 2020-09-20 LAB — GLUCOSE, CAPILLARY: Glucose-Capillary: 126 mg/dL — ABNORMAL HIGH (ref 70–99)

## 2020-09-20 SURGERY — LEFT HEART CATH AND CORONARY ANGIOGRAPHY
Anesthesia: LOCAL

## 2020-09-20 MED ORDER — HEPARIN (PORCINE) IN NACL 1000-0.9 UT/500ML-% IV SOLN
INTRAVENOUS | Status: DC | PRN
  Administered 2020-09-20 (×2): 500 mL

## 2020-09-20 MED ORDER — SODIUM CHLORIDE 0.9 % WEIGHT BASED INFUSION
3.0000 mL/kg/h | INTRAVENOUS | Status: AC
  Administered 2020-09-20: 3 mL/kg/h via INTRAVENOUS

## 2020-09-20 MED ORDER — FENTANYL CITRATE (PF) 100 MCG/2ML IJ SOLN
INTRAMUSCULAR | Status: AC
  Filled 2020-09-20: qty 2

## 2020-09-20 MED ORDER — SODIUM CHLORIDE 0.9% FLUSH: 3.0000 mL | Freq: Two times a day (BID) | INTRAVENOUS | Status: DC

## 2020-09-20 MED ORDER — SODIUM CHLORIDE 0.9% FLUSH: 3.0000 mL | INTRAVENOUS | Status: DC | PRN

## 2020-09-20 MED ORDER — MIDAZOLAM HCL 2 MG/2ML IJ SOLN
INTRAMUSCULAR | Status: AC
  Filled 2020-09-20: qty 2

## 2020-09-20 MED ORDER — HYDRALAZINE HCL 20 MG/ML IJ SOLN: 10.0000 mg | INTRAMUSCULAR | Status: DC | PRN

## 2020-09-20 MED ORDER — SODIUM CHLORIDE 0.9 % IV SOLN: 250.0000 mL | INTRAVENOUS | Status: DC | PRN

## 2020-09-20 MED ORDER — SODIUM CHLORIDE 0.9 % IV SOLN: INTRAVENOUS | Status: AC

## 2020-09-20 MED ORDER — VERAPAMIL HCL 2.5 MG/ML IV SOLN
INTRAVENOUS | Status: DC | PRN
  Administered 2020-09-20: 10 mL via INTRA_ARTERIAL

## 2020-09-20 MED ORDER — ASPIRIN 81 MG PO CHEW: 81.0000 mg | CHEWABLE_TABLET | ORAL | Status: DC

## 2020-09-20 MED ORDER — HEPARIN (PORCINE) IN NACL 1000-0.9 UT/500ML-% IV SOLN
INTRAVENOUS | Status: AC
  Filled 2020-09-20: qty 1000

## 2020-09-20 MED ORDER — IOPAMIDOL (ISOVUE-370) INJECTION 76%: INTRAVENOUS | Status: DC | PRN

## 2020-09-20 MED ORDER — LABETALOL HCL 5 MG/ML IV SOLN: 10.0000 mg | INTRAVENOUS | Status: DC | PRN

## 2020-09-20 MED ORDER — HEPARIN SODIUM (PORCINE) 1000 UNIT/ML IJ SOLN
INTRAMUSCULAR | Status: DC | PRN
  Administered 2020-09-20: 4000 [IU] via INTRAVENOUS

## 2020-09-20 MED ORDER — ONDANSETRON HCL 4 MG/2ML IJ SOLN: 4.0000 mg | Freq: Four times a day (QID) | INTRAMUSCULAR | Status: DC | PRN

## 2020-09-20 MED ORDER — LIDOCAINE HCL (PF) 1 % IJ SOLN
INTRAMUSCULAR | Status: AC
  Filled 2020-09-20: qty 30

## 2020-09-20 MED ORDER — SODIUM CHLORIDE 0.9 % WEIGHT BASED INFUSION: 1.0000 mL/kg/h | INTRAVENOUS | Status: DC

## 2020-09-20 MED ORDER — ACETAMINOPHEN 325 MG PO TABS: 650.0000 mg | ORAL_TABLET | ORAL | Status: DC | PRN

## 2020-09-20 MED ORDER — VERAPAMIL HCL 2.5 MG/ML IV SOLN
INTRAVENOUS | Status: AC
  Filled 2020-09-20: qty 2

## 2020-09-20 MED ORDER — HEPARIN SODIUM (PORCINE) 1000 UNIT/ML IJ SOLN
INTRAMUSCULAR | Status: AC
  Filled 2020-09-20: qty 1

## 2020-09-20 MED ORDER — MIDAZOLAM HCL 2 MG/2ML IJ SOLN
INTRAMUSCULAR | Status: DC | PRN
  Administered 2020-09-20: 1 mg via INTRAVENOUS
  Administered 2020-09-20: 2 mg via INTRAVENOUS

## 2020-09-20 MED ORDER — LIDOCAINE HCL (PF) 1 % IJ SOLN
INTRAMUSCULAR | Status: DC | PRN
  Administered 2020-09-20: 2 mL

## 2020-09-20 MED ORDER — FENTANYL CITRATE (PF) 100 MCG/2ML IJ SOLN
INTRAMUSCULAR | Status: DC | PRN
  Administered 2020-09-20 (×2): 25 ug via INTRAVENOUS

## 2020-09-20 MED ORDER — IOHEXOL 350 MG/ML SOLN
INTRAVENOUS | Status: DC | PRN
  Administered 2020-09-20: 40 mL via INTRA_ARTERIAL

## 2020-09-20 SURGICAL SUPPLY — 12 items
BAG SNAP BAND KOVER 36X36 (MISCELLANEOUS) ×2 IMPLANT
CATH 5FR JL3.5 JR4 ANG PIG MP (CATHETERS) ×2 IMPLANT
COVER DOME SNAP 22 D (MISCELLANEOUS) ×2 IMPLANT
DEVICE RAD COMP TR BAND LRG (VASCULAR PRODUCTS) ×2 IMPLANT
GLIDESHEATH SLEND SS 6F .021 (SHEATH) ×2 IMPLANT
GUIDEWIRE INQWIRE 1.5J.035X260 (WIRE) ×1 IMPLANT
INQWIRE 1.5J .035X260CM (WIRE) ×2
KIT HEART LEFT (KITS) ×2 IMPLANT
PACK CARDIAC CATHETERIZATION (CUSTOM PROCEDURE TRAY) ×2 IMPLANT
SHEATH PROBE COVER 6X72 (BAG) ×2 IMPLANT
TRANSDUCER W/STOPCOCK (MISCELLANEOUS) ×2 IMPLANT
TUBING CIL FLEX 10 FLL-RA (TUBING) ×2 IMPLANT

## 2020-09-20 NOTE — Progress Notes (Signed)
Arm board applied to right arm  

## 2020-09-20 NOTE — Research (Signed)
CADFEM-Identify Informed Consent   Subject Name: Timothy Pruitt  Subject met inclusion and exclusion criteria.  The informed consent form, study requirements and expectations were reviewed with the subject and questions and concerns were addressed prior to the signing of the consent form.  The subject verbalized understanding of the trial requirements.  The subject agreed to participate in the CADFEM-Identify trial and signed the informed consent at 0855 on 09/20/2020.  The informed consent was obtained prior to performance of any protocol-specific procedures for the subject.  A copy of the signed informed consent was given to the subject and a copy was placed in the subject's medical record.   Altovise Wahler

## 2020-09-20 NOTE — Interval H&P Note (Signed)
Cath Lab Visit (complete for each Cath Lab visit)  Clinical Evaluation Leading to the Procedure:   ACS: No.  Non-ACS:    Anginal Classification: CCS III  Anti-ischemic medical therapy: Minimal Therapy (1 class of medications)  Non-Invasive Test Results: Intermediate-risk stress test findings: cardiac mortality 1-3%/year  Prior CABG: No previous CABG      History and Physical Interval Note:  09/20/2020 10:58 AM  [name]  has presented today for surgery, with the diagnosis of chest pain.  The various methods of treatment have been discussed with the patient and family. After consideration of risks, benefits and other options for treatment, the patient has consented to  Procedure(s): LEFT HEART CATH AND CORONARY ANGIOGRAPHY (N/A) as a surgical intervention.  The patient's history has been reviewed, patient examined, no change in status, stable for surgery.  I have reviewed the patient's chart and labs.  Questions were answered to the patient's satisfaction.     Timothy Pruitt

## 2020-09-20 NOTE — Progress Notes (Signed)
Discharge instructions reviewed with pt and his wife (via telephone) both voice understanding.  

## 2020-09-20 NOTE — Discharge Instructions (Signed)
Radial Site Care  This sheet gives you information about how to care for yourself after your procedure. Your health care provider may also give you more specific instructions. If you have problems or questions, contact your health care provider. What can I expect after the procedure? After the procedure, it is common to have:  Bruising and tenderness at the catheter insertion area. Follow these instructions at home: Medicines  Take over-the-counter and prescription medicines only as told by your health care provider. Insertion site care 1. Follow instructions from your health care provider about how to take care of your insertion site. Make sure you: ? Wash your hands with soap and water before you remove your bandage (dressing). If soap and water are not available, use hand sanitizer. ? May remove dressing in 24 hours. 2. Check your insertion site every day for signs of infection. Check for: ? Redness, swelling, or pain. ? Fluid or blood. ? Pus or a bad smell. ? Warmth. 3. Do no take baths, swim, or use a hot tub for 5 days. 4. You may shower 24-48 hours after the procedure. ? Remove the dressing and gently wash the site with plain soap and water. ? Pat the area dry with a clean towel. ? Do not rub the site. That could cause bleeding. 5. Do not apply powder or lotion to the site. Activity  1. For 24 hours after the procedure, or as directed by your health care provider: ? Do not flex or bend the affected arm. ? Do not push or pull heavy objects with the affected arm. ? Do not drive yourself home from the hospital or clinic. You may drive 24 hours after the procedure. ? Do not operate machinery or power tools. ? KEEP ARM ELEVATED THE REMAINDER OF THE DAY. 2. Do not push, pull or lift anything that is heavier than 10 lb for 5 days. 3. Ask your health care provider when it is okay to: ? Return to work or school. ? Resume usual physical activities or sports. ? Resume sexual  activity. General instructions  If the catheter site starts to bleed, raise your arm and put firm pressure on the site. If the bleeding does not stop, get help right away. This is a medical emergency.  DRINK PLENTY OF FLUIDS FOR THE NEXT 2-3 DAYS.  No alcohol consumption for 24 hours after receiving sedation.  If you went home on the same day as your procedure, a responsible adult should be with you for the first 24 hours after you arrive home.  Keep all follow-up visits as told by your health care provider. This is important. Contact a health care provider if:  You have a fever.  You have redness, swelling, or yellow drainage around your insertion site. Get help right away if:  You have unusual pain at the radial site.  The catheter insertion area swells very fast.  The insertion area is bleeding, and the bleeding does not stop when you hold steady pressure on the area.  Your arm or hand becomes pale, cool, tingly, or numb. These symptoms may represent a serious problem that is an emergency. Do not wait to see if the symptoms will go away. Get medical help right away. Call your local emergency services (911 in the [place] Do not drive yourself to the hospital. Summary  After the procedure, it is common to have bruising and tenderness at the site.  Follow instructions from your health care provider about how to take care   of your radial site wound. Check the wound every day for signs of infection.  This information is not intended to replace advice given to you by your health care provider. Make sure you discuss any questions you have with your health care provider. Document Revised: 08/26/2017 Document Reviewed: 08/26/2017 Elsevier Patient Education  2020 [employer] 

## 2020-09-21 ENCOUNTER — Ambulatory Visit (INDEPENDENT_AMBULATORY_CARE_PROVIDER_SITE_OTHER): Payer: Medicare Other | Admitting: Ophthalmology

## 2020-09-21 DIAGNOSIS — H3581 Retinal edema: Secondary | ICD-10-CM | POA: Diagnosis not present

## 2020-09-21 DIAGNOSIS — H353211 Exudative age-related macular degeneration, right eye, with active choroidal neovascularization: Secondary | ICD-10-CM

## 2020-09-21 DIAGNOSIS — H43811 Vitreous degeneration, right eye: Secondary | ICD-10-CM | POA: Diagnosis not present

## 2020-09-21 DIAGNOSIS — H353122 Nonexudative age-related macular degeneration, left eye, intermediate dry stage: Secondary | ICD-10-CM | POA: Diagnosis not present

## 2020-09-21 DIAGNOSIS — H35372 Puckering of macula, left eye: Secondary | ICD-10-CM

## 2020-09-21 DIAGNOSIS — H25813 Combined forms of age-related cataract, bilateral: Secondary | ICD-10-CM

## 2020-09-21 NOTE — Progress Notes (Signed)
[facility] - Clinic Note  09/21/2020     CHIEF COMPLAINT Patient presents for Eye Problem (Last Saturday, pt started seeing "shooting stars" OD followed with floaters.  Vision appeared blurry. Since then this has improved gradually along with vision. )   HISTORY OF PRESENT ILLNESS: Timothy Pruitt is a 75 y.o. male who presents to the clinic today for:   HPI    Eye Problem     Additional comments: Last Saturday, pt started seeing "shooting stars" OD followed with floaters.  Vision appeared blurry. Since then this has improved gradually along with vision.           Comments    BS 158 last week A1C 6.9       Last edited by Joni Reining, COA on 09/21/2020  2:31 PM. (History)      Referring physician: Theodoro Kos, MD [id number] [place],  [place] [address]  HISTORICAL INFORMATION:   Selected notes from the MEDICAL RECORD NUMBER Referred by Dr. [name] for concern of exudative ARMD OD   CURRENT MEDICATIONS: No current outpatient medications on file. (Ophthalmic Drugs)   No current facility-administered medications for this visit. (Ophthalmic Drugs)   Current Outpatient Medications (Other)  Medication Sig  . aspirin EC 81 MG tablet Take 81 mg by mouth daily.  . busPIRone (BUSPAR) 15 MG tablet Take 15 mg by mouth 3 (three) times daily.  Marland Kitchen doxazosin (CARDURA) 4 MG tablet Take 4 mg by mouth daily.  . DULoxetine (CYMBALTA) 30 MG capsule Take 30 mg by mouth daily.  . Fingerstix Lancets MISC Inject into the skin.  Marland Kitchen losartan (COZAAR) 100 MG tablet Take 100 mg by mouth daily.  . metoprolol succinate (TOPROL-XL) 25 MG 24 hr tablet Take 25 mg by mouth every morning.  . Multiple Vitamins-Minerals (PRESERVISION AREDS 2 PO) Take 1 tablet by mouth in the morning and at bedtime.  . Omega-3 Fatty Acids (KP FISH OIL) 1200 MG CAPS Take 3,600 capsules by mouth daily.  . sildenafil (REVATIO) 20 MG tablet Take 20 mg by mouth daily as needed (ED).  .  tamsulosin (FLOMAX) 0.4 MG CAPS capsule Take 0.4 mg by mouth daily.  Marland Kitchen glucose blood (ACCU-CHEK AVIVA PLUS) test strip 1 strip by external route every day   Current Facility-Administered Medications (Other)  Medication Route  . Bevacizumab (AVASTIN) SOLN 1.25 mg Intravitreal  . Bevacizumab (AVASTIN) SOLN 1.25 mg Intravitreal  . Bevacizumab (AVASTIN) SOLN 1.25 mg Intravitreal  . Bevacizumab (AVASTIN) SOLN 1.25 mg Intravitreal  . Bevacizumab (AVASTIN) SOLN 1.25 mg Intravitreal  . Bevacizumab (AVASTIN) SOLN 1.25 mg Intravitreal  . Bevacizumab (AVASTIN) SOLN 1.25 mg Intravitreal  . sodium chloride flush (NS) 0.9 % injection 3 mL Intravenous      REVIEW OF SYSTEMS: ROS    Positive for: Genitourinary, Endocrine, Eyes, Psychiatric   Negative for: Constitutional, Gastrointestinal, Neurological, Skin, Musculoskeletal, HENT, Cardiovascular, Respiratory, Allergic/Imm, Heme/Lymph   Last edited by Joni Reining, COA on 09/21/2020  2:31 PM. (History)       ALLERGIES Allergies  Allergen Reactions  . Statins Other (See Comments)    Muscle pain  . Sulfa Antibiotics Hives  . Trazodone And Nefazodone Swelling    PAST MEDICAL HISTORY Past Medical History:  Diagnosis Date  . Aortic atherosclerosis (HCC)    CT imaging  . Cataract    Combined forms OU  . Depression   . Essential hypertension   . Macular degeneration    Wet OD, Dry OS  .  Mixed hyperlipidemia   . Nephrolithiasis    Status post lithotripsy  . Right arm fracture    Age 89  . Statin intolerance   . Type 2 diabetes mellitus (HCC)    Past Surgical History:  Procedure Laterality Date  . APPENDECTOMY    . COLONOSCOPY    . LEFT HEART CATH AND CORONARY ANGIOGRAPHY N/A 09/20/2020   Procedure: LEFT HEART CATH AND CORONARY ANGIOGRAPHY;  Surgeon: Corky Crafts, MD;  Location: [facility] INVASIVE CV LAB;  Service: Cardiovascular;  Laterality: N/A;  . Skin cancer resection    . TONSILLECTOMY      FAMILY HISTORY Family History   Problem Relation Age of Onset  . Cataracts Mother   . Fuch's dystrophy Mother   . Glaucoma Mother   . Stroke Mother   . Cancer Mother   . Diabetes Brother   . CAD Father   . Arthritis Father     SOCIAL HISTORY Social History   Tobacco Use  . Smoking status: Former Smoker    Types: Cigarettes  . Smokeless tobacco: Never Used  . Tobacco comment: Quit 1995  Vaping Use  . Vaping Use: Never used  Substance Use Topics  . Alcohol use: Yes  . Drug use: Never         OPHTHALMIC EXAM:  Base Eye Exam    Visual Acuity (Snellen - Linear)      Right Left   Dist cc 20/25 20/40   Dist ph cc  NI       Tonometry (Tonopen, 2:35 PM)      Right Left   Pressure 15 18       Pupils      Dark Light Shape React APD   Right 4 3 Round Brisk None   Left 4 3 Round Brisk None       Visual Fields (Counting fingers)      Left Right    Full Full       Extraocular Movement      Right Left    Full Full       Neuro/Psych    Oriented x3: Yes   Mood/Affect: Normal       Dilation    Both eyes: 1.0% Mydriacyl, 2.5% Phenylephrine @ 2:36 PM        Slit Lamp and Fundus Exam    Slit Lamp Exam      Right Left   Lids/Lashes Dermatochalasis - upper lid Dermatochalasis - upper lid   Conjunctiva/Sclera White and quiet White and quiet   Cornea Arcus, trace tear film debris Arcus, trace tear film debris, trace Punctate epithelial erosions   Anterior Chamber Deep and quiet Deep and quiet   Iris Round and dilated Round and dilated   Lens 2-3+ Nuclear sclerosis with early brunescence, 2-3+ Cortical cataract 2+ Nuclear sclerosis with brunescence, 3+ Cortical cataract   Vitreous Vitreous syneresis, Posterior vitreous detachment, vitreous condensations Vitreous syneresis       Fundus Exam      Right Left   Disc Compact, mild Temporal Peripapillary atrophy, Pink and Sharp Pink and Sharp, mild Temporal Peripapillary atrophy, Compact   C/D Ratio 0.1 0.1   Macula Blunted foveal reflex,  central PED, central pigment clumping, RPE mottling and clumping, Drusen, no heme Blunted foveal reflex, Epiretinal membrane, RPE mottling /  Clumping / atophy, Drusen, No heme or edema, patch of RPE atrophy IT macula, stable   Vessels attenuated, mild tortuousity, mild AV crossing changes attenuated, mild tortuousity  Periphery Attached, scattered RPE changes, no heme, peripheral drusen, mild reticular degeneration, no RT/RD on 360 scleral depression Attached, scattered peripheral drusen           IMAGING AND PROCEDURES  Imaging and Procedures for @TODAY @  OCT, Retina - OU - Both Eyes       Right Eye Quality was good. Central Foveal Thickness: 316. Progression has been stable. Findings include abnormal foveal contour, no IRF, pigment epithelial detachment, retinal drusen , no SRF, epiretinal membrane, vitreomacular adhesion , myopic contour (Stable central PED w/ stable improvement in overlying ellipsoid signal.  Interval release of partial PVD.).   Left Eye Quality was good. Central Foveal Thickness: 269. Progression has been stable. Findings include normal foveal contour, epiretinal membrane, retinal drusen , pigment epithelial detachment, outer retinal atrophy, no SRF, no IRF, inner retinal atrophy (Persistent ERM; Stable patch of ORA and overlying atrophy IT macula).   Notes *Images captured and stored on drive  Diagnosis / Impression:  OD: Exudative ARMD - no IRF/SRF; stable central PED.  Interval release of partial PVD. OS: Non- Exudative ARMD with early atrophy; ERM -- persistent; Stable patch of ORA and overlying atrophy IT macula   Clinical management:  See below  Abbreviations: NFP - Normal foveal profile. CME - cystoid macular edema. PED - pigment epithelial detachment. IRF - intraretinal fluid. SRF - subretinal fluid. EZ - ellipsoid zone. ERM - epiretinal membrane. ORA - outer retinal atrophy. ORT - outer retinal tubulation. SRHM - subretinal hyper-reflective  material                  ASSESSMENT/PLAN:    ICD-10-CM   1. Posterior vitreous detachment of right eye  H43.811   2. Exudative age-related macular degeneration of right eye with active choroidal neovascularization (HCC)  H35.3211   3. Retinal edema  H35.81 OCT, Retina - OU - Both Eyes  4. Intermediate stage nonexudative age-related macular degeneration of left eye  H35.3122   5. Epiretinal membrane (ERM) of left eye  H35.372   6. Combined forms of age-related cataract of both eyes  H25.813      1. Acute PVD OD  - 1 wk history of flashes/floaters OD  - Discussed findings and prognosis  - No RT or RD on 360 scleral depressed exam  - Reviewed s/s of RT/RD  - Strict return precautions for any such RT/RD signs/symptoms  - f/u in 4-6 wks -- DFE/OCT  2,3. Exudative age related macular degeneration, OD  - at initial presentation, pt endorsed metamorphopsia   - OCT OD with PED and overlying SRF + drusen  - S/P IVA OD #1 (05.08.19) early resolution, #2 (06.06.19) #3 (07.03.19), #4 (08.13.19), #5 (10.08.19), #6 (12.13.19), #7 (03.12.20),#8 (07.02.20), #9 (10.20.20), #10 (02.15.21), #11 (06.16.21)  - today, pt reports stable resolution of metamorphopsia   - OCT shows stable resolution of SRF and stable PED despite no IVA since 6.16.21  - BCVA stable at 20/25   - exam stable without heme  - recommend holding IVA OD today  - pt in agreement  - f/u in 4-6 months, sooner prn -- DFE/OCT  4. Age related macular degeneration, non-exudative, OS  - The incidence, anatomy, and pathology of dry AMD, risk of progression, and the AREDS and AREDS 2 study including smoking risks discussed with patient.  - continue amsler grid monitoring  5. Epiretinal membrane, OS  - mild ERM  - asymptomatic, no metamorphopsia  - no indication for surgery at this time  -  monitor for now  - f/u in 4 months  6. Combined form age-related cataract OU-   - The symptoms of cataract, surgical options, and  treatments and risks were discussed with patient.  - mild progression on exam today  - mild [place] in [place] to 20/40 from 20/30 OS  - recommend cataract eval -- pt to return to Dr. Si GaulFriedrichs for cataract management / referral  Ophthalmic Meds Ordered this visit:  No orders of the defined types were placed in this encounter.      Return for 4-6 wk f/u for PVD OD w/DFE/OCT.  There are no Patient Instructions on file for this visit.  This document serves as a record of services personally performed by Karie ChimeraBrian G. Myiah Petkus, MD, PhD. It was created on their behalf by Cristopher EstimableAndrew Baxley, COT an ophthalmic technician. The creation of this record is the provider's dictation and/or activities during the visit.    Electronically signed by: Cristopher Estimablendrew Baxley, COT 2.18.22 @ 8:47 PM   Karie ChimeraBrian G. Dody Smartt, M.D., Ph.D. Diseases & Surgery of the Retina and Vitreous [facility] 09/21/2020   I have reviewed the above documentation for accuracy and completeness, and I agree with the above. Karie ChimeraBrian G. Langley Ingalls, M.D., Ph.D. 09/23/20 8:47 PM   Abbreviations: M myopia (nearsighted); A astigmatism; H hyperopia (farsighted); P presbyopia; Mrx spectacle prescription;  CTL contact lenses; OD right eye; OS left eye; OU both eyes  XT exotropia; ET esotropia; PEK punctate epithelial keratitis; PEE punctate epithelial erosions; DES dry eye syndrome; MGD meibomian gland dysfunction; ATs artificial tears; PFAT's preservative free artificial tears; NSC nuclear sclerotic cataract; PSC posterior subcapsular cataract; ERM epi-retinal membrane; PVD posterior vitreous detachment; RD retinal detachment; DM diabetes mellitus; DR diabetic retinopathy; NPDR non-proliferative diabetic retinopathy; PDR proliferative diabetic retinopathy; CSME clinically significant macular edema; DME diabetic macular edema; dbh dot blot hemorrhages; CWS cotton wool spot; POAG primary open angle glaucoma; C/D cup-to-disc ratio; HVF humphrey visual  field; GVF goldmann visual field; OCT optical coherence tomography; IOP intraocular pressure; BRVO Branch retinal vein occlusion; CRVO central retinal vein occlusion; CRAO central retinal artery occlusion; BRAO branch retinal artery occlusion; RT retinal tear; SB scleral buckle; PPV pars plana vitrectomy; VH Vitreous hemorrhage; PRP panretinal laser photocoagulation; IVK intravitreal kenalog; VMT vitreomacular traction; MH Macular hole;  NVD neovascularization of the disc; NVE neovascularization elsewhere; AREDS age related eye disease study; ARMD age related macular degeneration; POAG primary open angle glaucoma; EBMD epithelial/anterior basement membrane dystrophy; ACIOL anterior chamber intraocular lens; IOL intraocular lens; PCIOL posterior chamber intraocular lens; Phaco/IOL phacoemulsification with intraocular lens placement; PRK photorefractive keratectomy; LASIK laser assisted in situ keratomileusis; HTN hypertension; DM diabetes mellitus; COPD chronic obstructive pulmonary disease

## 2020-09-23 ENCOUNTER — Encounter (INDEPENDENT_AMBULATORY_CARE_PROVIDER_SITE_OTHER): Payer: Self-pay | Admitting: Ophthalmology

## 2020-09-24 ENCOUNTER — Encounter (INDEPENDENT_AMBULATORY_CARE_PROVIDER_SITE_OTHER): Payer: Medicare Other | Admitting: Ophthalmology

## 2020-10-18 ENCOUNTER — Ambulatory Visit: Payer: Medicare Other | Admitting: Family Medicine

## 2020-10-18 NOTE — Progress Notes (Signed)
[facility] - Clinic Note  10/22/2020     CHIEF COMPLAINT Patient presents for Retina Follow Up   HISTORY OF PRESENT ILLNESS: Timothy Pruitt is a 74 y.o. male who presents to the clinic today for:   HPI    Retina Follow Up    Patient presents with  PVD.  In right eye.  This started weeks ago.  Severity is moderate.  Duration of weeks.  Since onset it is stable.  I, the attending physician,  performed the HPI with the patient and updated documentation appropriately.          Comments    Pt states vision is the same OU.  Pt still having persistent floaters OD; denies FOL OD.  Pt has had new onset of FOL OS in periphery--states they come and go x 3 days.  Pt has not had any new floaters OS.  Denies any headache or migraine.       Last edited by Rennis Chris, MD on 10/23/2020  1:53 AM. (History)    pt states he is still seeing persistent floaters in his right eye, he states the fol in that eye have stopped, he feels like his right eye is more blurry today, he states he just started seeing new fol in the left eye 3 days ago  Referring physician: Theodoro Kos, MD [id number] [place],  [place] [address]  HISTORICAL INFORMATION:   Selected notes from the MEDICAL RECORD NUMBER Referred by Dr. [name] for concern of exudative ARMD OD   CURRENT MEDICATIONS: No current outpatient medications on file. (Ophthalmic Drugs)   No current facility-administered medications for this visit. (Ophthalmic Drugs)   Current Outpatient Medications (Other)  Medication Sig  . aspirin EC 81 MG tablet Take 81 mg by mouth daily.  . busPIRone (BUSPAR) 15 MG tablet Take 15 mg by mouth 3 (three) times daily.  Marland Kitchen doxazosin (CARDURA) 4 MG tablet Take 4 mg by mouth daily.  . DULoxetine (CYMBALTA) 30 MG capsule Take 30 mg by mouth daily.  . Fingerstix Lancets MISC Inject into the skin.  Marland Kitchen glucose blood (ACCU-CHEK AVIVA PLUS) test strip 1 strip by external route every day  .  losartan (COZAAR) 100 MG tablet Take 100 mg by mouth daily.  . metoprolol succinate (TOPROL-XL) 25 MG 24 hr tablet Take 25 mg by mouth every morning.  . Multiple Vitamins-Minerals (PRESERVISION AREDS 2 PO) Take 1 tablet by mouth in the morning and at bedtime.  . Omega-3 Fatty Acids (KP FISH OIL) 1200 MG CAPS Take 3,600 capsules by mouth daily.  . sildenafil (REVATIO) 20 MG tablet Take 20 mg by mouth daily as needed (ED).  . tamsulosin (FLOMAX) 0.4 MG CAPS capsule Take 0.4 mg by mouth daily.   Current Facility-Administered Medications (Other)  Medication Route  . Bevacizumab (AVASTIN) SOLN 1.25 mg Intravitreal  . Bevacizumab (AVASTIN) SOLN 1.25 mg Intravitreal  . Bevacizumab (AVASTIN) SOLN 1.25 mg Intravitreal  . Bevacizumab (AVASTIN) SOLN 1.25 mg Intravitreal  . Bevacizumab (AVASTIN) SOLN 1.25 mg Intravitreal  . Bevacizumab (AVASTIN) SOLN 1.25 mg Intravitreal  . Bevacizumab (AVASTIN) SOLN 1.25 mg Intravitreal  . sodium chloride flush (NS) 0.9 % injection 3 mL Intravenous      REVIEW OF SYSTEMS: ROS    Positive for: Genitourinary, Endocrine, Eyes, Psychiatric   Negative for: Constitutional, Gastrointestinal, Neurological, Skin, Musculoskeletal, HENT, Cardiovascular, Respiratory, Allergic/Imm, Heme/Lymph   Last edited by Corrinne Eagle on 10/22/2020  1:31 PM. (History)  ALLERGIES Allergies  Allergen Reactions  . Statins Other (See Comments)    Muscle pain  . Sulfa Antibiotics Hives  . Trazodone And Nefazodone Swelling    PAST MEDICAL HISTORY Past Medical History:  Diagnosis Date  . Aortic atherosclerosis (HCC)    CT imaging  . Cataract    Combined forms OU  . Depression   . Essential hypertension   . Macular degeneration    Wet OD, Dry OS  . Mixed hyperlipidemia   . Nephrolithiasis    Status post lithotripsy  . Right arm fracture    Age 7  . Statin intolerance   . Type 2 diabetes mellitus (HCC)    Past Surgical History:  Procedure Laterality Date  .  APPENDECTOMY    . COLONOSCOPY    . LEFT HEART CATH AND CORONARY ANGIOGRAPHY N/A 09/20/2020   Procedure: LEFT HEART CATH AND CORONARY ANGIOGRAPHY;  Surgeon: Corky Crafts, MD;  Location: [facility] INVASIVE CV LAB;  Service: Cardiovascular;  Laterality: N/A;  . Skin cancer resection    . TONSILLECTOMY      FAMILY HISTORY Family History  Problem Relation Age of Onset  . Cataracts Mother   . Fuch's dystrophy Mother   . Glaucoma Mother   . Stroke Mother   . Cancer Mother   . Diabetes Brother   . CAD Father   . Arthritis Father     SOCIAL HISTORY Social History   Tobacco Use  . Smoking status: Former Smoker    Types: Cigarettes  . Smokeless tobacco: Never Used  . Tobacco comment: Quit 1995  Vaping Use  . Vaping Use: Never used  Substance Use Topics  . Alcohol use: Yes  . Drug use: Never         OPHTHALMIC EXAM:  Base Eye Exam    Visual Acuity (Snellen - Linear)      Right Left   Dist cc 20/25 -1 20/40 -1   Dist ph cc NI NI   Correction: Glasses       Tonometry (Tonopen, 1:34 PM)      Right Left   Pressure 08 10       Pupils      Dark Light Shape React APD   Right 3 2 Round Brisk 0   Left 3 2 Round Brisk 0       Visual Fields      Left Right    Full Full       Extraocular Movement      Right Left    Full Full       Neuro/Psych    Oriented x3: Yes   Mood/Affect: Normal       Dilation    Both eyes: 1.0% Mydriacyl, 2.5% Phenylephrine @ 1:34 PM        Slit Lamp and Fundus Exam    Slit Lamp Exam      Right Left   Lids/Lashes Dermatochalasis - upper lid Dermatochalasis - upper lid   Conjunctiva/Sclera White and quiet White and quiet   Cornea Arcus, trace tear film debris Arcus, trace tear film debris, trace Punctate epithelial erosions   Anterior Chamber Deep and quiet Deep and quiet   Iris Round and dilated Round and dilated   Lens 2-3+ Nuclear sclerosis with early brunescence, 2-3+ Cortical cataract 2+ Nuclear sclerosis with brunescence, 3+  Cortical cataract   Vitreous Vitreous syneresis, Posterior vitreous detachment, vitreous condensations, vitreous debris Vitreous syneresis       Fundus Exam  Right Left   Disc Compact, mild Temporal Peripapillary atrophy, Pink and Sharp Pink and Sharp, mild Temporal Peripapillary atrophy, Compact   C/D Ratio 0.1 0.2   Macula Blunted foveal reflex, central PED, central pigment clumping, RPE mottling and clumping, Drusen, no heme Blunted foveal reflex, Epiretinal membrane, RPE mottling /  Clumping / atophy, Drusen, No heme or edema, patch of RPE atrophy IT macula, stable   Vessels mild attenuation, mild AV crossing changes attenuated, mild tortuousity   Periphery Attached, scattered RPE changes, no heme, peripheral drusen, mild reticular degeneration, no RT/RD on 360 scleral depression Attached, scattered peripheral drusen         Refraction    Wearing Rx      Sphere Cylinder Axis   Right -3.25 +0.50 016   Left -2.75 Sphere    Type: SVL          IMAGING AND PROCEDURES  Imaging and Procedures for @TODAY @  OCT, Retina - OU - Both Eyes       Right Eye Quality was good. Central Foveal Thickness: 325. Progression has been stable. Findings include abnormal foveal contour, no IRF, pigment epithelial detachment, retinal drusen , no SRF, epiretinal membrane, vitreomacular adhesion , myopic contour (Stable central PED w/ stable improvement in overlying ellipsoid signal.  stable release of partial PVD.).   Left Eye Quality was good. Central Foveal Thickness: 310. Progression has been stable. Findings include normal foveal contour, epiretinal membrane, retinal drusen , pigment epithelial detachment, outer retinal atrophy, no SRF, no IRF, inner retinal atrophy (Persistent ERM; Stable patch of ORA and overlying atrophy IT macula).   Notes *Images captured and stored on drive  Diagnosis / Impression:  OD: Exudative ARMD - no IRF/SRF; stable central PED.  Stable release of partial  PVD. OS: Non- Exudative ARMD with early atrophy; ERM -- persistent; Stable patch of ORA and overlying atrophy IT macula   Clinical management:  See below  Abbreviations: NFP - Normal foveal profile. CME - cystoid macular edema. PED - pigment epithelial detachment. IRF - intraretinal fluid. SRF - subretinal fluid. EZ - ellipsoid zone. ERM - epiretinal membrane. ORA - outer retinal atrophy. ORT - outer retinal tubulation. SRHM - subretinal hyper-reflective material                  ASSESSMENT/PLAN:    ICD-10-CM   1. Posterior vitreous detachment of right eye  H43.811   2. Exudative age-related macular degeneration of right eye with active choroidal neovascularization (HCC)  H35.3211   3. Retinal edema  H35.81 OCT, Retina - OU - Both Eyes  4. Intermediate stage nonexudative age-related macular degeneration of left eye  H35.3122   5. Epiretinal membrane (ERM) of left eye  H35.372   6. Combined forms of age-related cataract of both eyes  H25.813      1. Subacute PVD OD  - 1 wk history of flashes/floaters OD prior to last visit  - today, flashes/floaters symptoms improved OD, but pt reports 3 day history of new photopsias OS  - OCT shows stable release of PVD OD, OS with partial PVD still attached to disc  - Discussed findings and prognosis  - concern for impending PVD OS  - No RT or RD on repeat 360 peripheral exam OU  - Reviewed s/s of RT/RD  - Strict return precautions for any such RT/RD signs/symptoms  - f/u in 4-6 wks -- DFE/OCT -- mainly f/u flashes/floaters OS  2,3. Exudative age related macular degeneration, OD  - at  initial presentation, pt endorsed metamorphopsia   - OCT OD with PED and overlying SRF + drusen  - S/P IVA OD #1 (05.08.19) early resolution, #2 (06.06.19) #3 (07.03.19), #4 (08.13.19), #5 (10.08.19), #6 (12.13.19), #7 (03.12.20),#8 (07.02.20), #9 (10.20.20), #10 (02.15.21), #11 (06.16.21)  - today, pt reports stable resolution of metamorphopsia   - OCT  shows stable resolution of SRF and stable PED despite no IVA since 06.16.21  - BCVA stable at 20/25   - exam stable without heme  - recommend holding IVA OD today  - pt in agreement  - f/u in 4-6 months, sooner prn -- DFE/OCT  4. Age related macular degeneration, non-exudative, OS  - The incidence, anatomy, and pathology of dry AMD, risk of progression, and the AREDS and AREDS 2 study including smoking risks discussed with patient.  - continue amsler grid monitoring  5. Epiretinal membrane, OS  - mild ERM  - asymptomatic, no metamorphopsia  - no indication for surgery at this time  - monitor for now  - f/u in 4 months  6. Combined form age-related cataract OU-   - The symptoms of cataract, surgical options, and treatments and risks were discussed with patient.  - mild progression on exam today  - VA stable at 20/40 OS  - recommend cataract eval -- pt to return to Dr. Si Gaul for cataract management / referral  Ophthalmic Meds Ordered this visit:  No orders of the defined types were placed in this encounter.      Return for f/u 4-6 weeks, PVD OD, DFE, OCT.  There are no Patient Instructions on file for this visit.  This document serves as a record of services personally performed by Karie Chimera, MD, PhD. It was created on their behalf by Herby Abraham, COA, an ophthalmic technician. The creation of this record is the provider's dictation and/or activities during the visit.    Electronically signed by: Herby Abraham, COA 03.17.2022 1:53 AM   This document serves as a record of services personally performed by Karie Chimera, MD, PhD. It was created on their behalf by Glee Arvin. Manson Passey, OA an ophthalmic technician. The creation of this record is the provider's dictation and/or activities during the visit.    Electronically signed by: Glee Arvin. Manson Passey, [place] 03.21.2022 1:53 AM  Karie Chimera, M.D., Ph.D. Diseases & Surgery of the Retina and Vitreous [facility] 10/22/2020   I have reviewed the above documentation for accuracy and completeness, and I agree with the above. Karie Chimera, M.D., Ph.D. 10/23/20 1:57 AM   Abbreviations: M myopia (nearsighted); A astigmatism; H hyperopia (farsighted); P presbyopia; Mrx spectacle prescription;  CTL contact lenses; OD right eye; OS left eye; OU both eyes  XT exotropia; ET esotropia; PEK punctate epithelial keratitis; PEE punctate epithelial erosions; DES dry eye syndrome; MGD meibomian gland dysfunction; ATs artificial tears; PFAT's preservative free artificial tears; NSC nuclear sclerotic cataract; PSC posterior subcapsular cataract; ERM epi-retinal membrane; PVD posterior vitreous detachment; RD retinal detachment; DM diabetes mellitus; DR diabetic retinopathy; NPDR non-proliferative diabetic retinopathy; PDR proliferative diabetic retinopathy; CSME clinically significant macular edema; DME diabetic macular edema; dbh dot blot hemorrhages; CWS cotton wool spot; POAG primary open angle glaucoma; C/D cup-to-disc ratio; HVF humphrey visual field; GVF goldmann visual field; OCT optical coherence tomography; IOP intraocular pressure; BRVO Branch retinal vein occlusion; CRVO central retinal vein occlusion; CRAO central retinal artery occlusion; BRAO branch retinal artery occlusion; RT retinal tear; SB scleral buckle; PPV pars  plana vitrectomy; VH Vitreous hemorrhage; PRP panretinal laser photocoagulation; IVK intravitreal kenalog; VMT vitreomacular traction; MH Macular hole;  NVD neovascularization of the disc; NVE neovascularization elsewhere; AREDS age related eye disease study; ARMD age related macular degeneration; POAG primary open angle glaucoma; EBMD epithelial/anterior basement membrane dystrophy; ACIOL anterior chamber intraocular lens; IOL intraocular lens; PCIOL posterior chamber intraocular lens; Phaco/IOL phacoemulsification with intraocular lens placement; PRK photorefractive keratectomy;  LASIK laser assisted in situ keratomileusis; HTN hypertension; DM diabetes mellitus; COPD chronic obstructive pulmonary disease

## 2020-10-22 ENCOUNTER — Ambulatory Visit (INDEPENDENT_AMBULATORY_CARE_PROVIDER_SITE_OTHER): Payer: Medicare Other | Admitting: Ophthalmology

## 2020-10-22 ENCOUNTER — Other Ambulatory Visit: Payer: Self-pay

## 2020-10-22 DIAGNOSIS — H353122 Nonexudative age-related macular degeneration, left eye, intermediate dry stage: Secondary | ICD-10-CM

## 2020-10-22 DIAGNOSIS — H3581 Retinal edema: Secondary | ICD-10-CM | POA: Diagnosis not present

## 2020-10-22 DIAGNOSIS — H353211 Exudative age-related macular degeneration, right eye, with active choroidal neovascularization: Secondary | ICD-10-CM | POA: Diagnosis not present

## 2020-10-22 DIAGNOSIS — H43811 Vitreous degeneration, right eye: Secondary | ICD-10-CM

## 2020-10-22 DIAGNOSIS — H35372 Puckering of macula, left eye: Secondary | ICD-10-CM

## 2020-10-22 DIAGNOSIS — H25813 Combined forms of age-related cataract, bilateral: Secondary | ICD-10-CM

## 2020-10-23 ENCOUNTER — Encounter (INDEPENDENT_AMBULATORY_CARE_PROVIDER_SITE_OTHER): Payer: Self-pay | Admitting: Ophthalmology

## 2020-10-23 ENCOUNTER — Ambulatory Visit (INDEPENDENT_AMBULATORY_CARE_PROVIDER_SITE_OTHER): Payer: Medicare Other | Admitting: Cardiology

## 2020-10-23 ENCOUNTER — Encounter: Payer: Self-pay | Admitting: Cardiology

## 2020-10-23 VITALS — BP 120/60 | HR 73 | Wt 176.8 lb

## 2020-10-23 DIAGNOSIS — I2510 Atherosclerotic heart disease of native coronary artery without angina pectoris: Secondary | ICD-10-CM

## 2020-10-23 DIAGNOSIS — I10 Essential (primary) hypertension: Secondary | ICD-10-CM | POA: Diagnosis not present

## 2020-10-23 DIAGNOSIS — E782 Mixed hyperlipidemia: Secondary | ICD-10-CM

## 2020-10-23 MED ORDER — EZETIMIBE 10 MG PO TABS: 10.0000 mg | ORAL_TABLET | Freq: Every day | ORAL | 1 refills | Status: DC

## 2020-10-23 NOTE — Patient Instructions (Addendum)
Medication Instructions:   Your physician has recommended you make the following change in your medication:   Start zetia 10 mg by mouth daily at bedtime  Continue other medications the same  Labwork: Your physician recommends that you return for a FASTING lipid profile: in 6 months just before your next visit. Please do not eat or drink for at least 8 hours when you have this done. You may take your medications that morning with a sip of water.  This may be done at [facility] from 8:00 am - 4:00 pm. No appointment is needed.  Testing/Procedures:  none  Follow-Up:  Your physician recommends that you schedule a follow-up appointment in: 6 months.  Any Other Special Instructions Will Be Listed Below (If Applicable).  If you need a refill on your cardiac medications before your next appointment, please call your pharmacy.

## 2020-10-23 NOTE — Progress Notes (Signed)
Cardiology Office Note  Date: 10/23/2020   ID: Jasaiah, Karwowski [birth year], MRN [id number]  PCP:  Theodoro Kos, MD  Cardiologist:  Nona Dell, MD Electrophysiologist:  None   Chief Complaint  Patient presents with  . Cardiac follow-up    History of Present Illness: Timothy Pruitt is a 75 y.o. male seen in consultation in February.  He was referred at that time for further cardiac evaluation following abnormal myocardial perfusion study done through the [facility] clinic in the setting of cardiovascular risk factors and history of chest discomfort with dyspnea on exertion.  He underwent a diagnostic cardiac catheterization on February 17 which fortunately demonstrated only mild coronary atherosclerosis, no obstructive lesions to require revascularization.  He is here with his wife for follow-up, overall reassured by the cardiac catheterization findings.  I talked with him about basic risk factor reduction and potential medication adjustments.  As noted previously, he has a history of multistatin intolerance.  His LDL was 122 by last check.  We discussed a trial of Zetia 10 mg daily.  Past Medical History:  Diagnosis Date  . Aortic atherosclerosis (HCC)    CT imaging  . Cataract    Combined forms OU  . Depression   . Essential hypertension   . Macular degeneration    Wet OD, Dry OS  . Mixed hyperlipidemia   . Nephrolithiasis    Status post lithotripsy  . Right arm fracture    Age 69  . Statin intolerance   . Type 2 diabetes mellitus (HCC)     Past Surgical History:  Procedure Laterality Date  . APPENDECTOMY    . COLONOSCOPY    . LEFT HEART CATH AND CORONARY ANGIOGRAPHY N/A 09/20/2020   Procedure: LEFT HEART CATH AND CORONARY ANGIOGRAPHY;  Surgeon: Corky Crafts, MD;  Location: [facility] INVASIVE CV LAB;  Service: Cardiovascular;  Laterality: N/A;  . Skin cancer resection    . TONSILLECTOMY      Current Outpatient Medications  Medication Sig Dispense Refill  .  aspirin EC 81 MG tablet Take 81 mg by mouth daily.    . busPIRone (BUSPAR) 15 MG tablet Take 15 mg by mouth 3 (three) times daily.    . ciprofloxacin (CIPRO) 500 MG tablet Take 500 mg by mouth 2 (two) times daily.    Marland Kitchen doxazosin (CARDURA) 4 MG tablet Take 4 mg by mouth daily.    . DULoxetine (CYMBALTA) 30 MG capsule Take 30 mg by mouth daily.    . Fingerstix Lancets MISC Inject into the skin.    Marland Kitchen glucose blood (ACCU-CHEK AVIVA PLUS) test strip 1 strip by external route every day    . losartan (COZAAR) 100 MG tablet Take 100 mg by mouth daily.  2  . metoprolol succinate (TOPROL-XL) 25 MG 24 hr tablet Take 25 mg by mouth every morning.    . Multiple Vitamins-Minerals (PRESERVISION AREDS 2 PO) Take 1 tablet by mouth in the morning and at bedtime.    . Omega-3 Fatty Acids (KP FISH OIL) 1200 MG CAPS Take 3,600 capsules by mouth daily.    . sildenafil (REVATIO) 20 MG tablet Take 20 mg by mouth daily as needed (ED).    Marland Kitchen ezetimibe (ZETIA) 10 MG tablet Take 1 tablet (10 mg total) by mouth daily. 90 tablet 1   Current Facility-Administered Medications  Medication Dose Route Frequency Provider Last Rate Last Admin  . Bevacizumab (AVASTIN) SOLN 1.25 mg  1.25 mg Intravitreal  Rennis Chris, MD  1.25 mg at 12/12/17 1452  . Bevacizumab (AVASTIN) SOLN 1.25 mg  1.25 mg Intravitreal  Rennis Chris, MD   1.25 mg at 01/07/18 1109  . Bevacizumab (AVASTIN) SOLN 1.25 mg  1.25 mg Intravitreal  Rennis Chris, MD   1.25 mg at 02/03/18 1141  . Bevacizumab (AVASTIN) SOLN 1.25 mg  1.25 mg Intravitreal  Rennis Chris, MD   1.25 mg at 03/16/18 1219  . Bevacizumab (AVASTIN) SOLN 1.25 mg  1.25 mg Intravitreal  Rennis Chris, MD   1.25 mg at 05/11/18 1309  . Bevacizumab (AVASTIN) SOLN 1.25 mg  1.25 mg Intravitreal  Rennis Chris, MD   1.25 mg at 07/19/18 1357  . Bevacizumab (AVASTIN) SOLN 1.25 mg  1.25 mg Intravitreal  Rennis Chris, MD   1.25 mg at 10/15/18 1652  . sodium chloride flush (NS) 0.9 % injection 3 mL  3 mL  Intravenous Q12H Jonelle Sidle, MD       Allergies:  Statins, Sulfa antibiotics, and Trazodone and nefazodone   ROS: No palpitations or syncope.  Physical Exam: VS:  BP 120/60 (BP Location: Right Arm, Patient Position: Sitting, Cuff Size: Normal)   Pulse 73   Wt 176 lb 12.8 oz (80.2 kg)   SpO2 98%   BMI 26.88 kg/m , BMI Body mass index is 26.88 kg/m.  Wt Readings from Last 3 Encounters:  10/23/20 176 lb 12.8 oz (80.2 kg)  09/20/20 175 lb (79.4 kg)  09/13/20 177 lb (80.3 kg)    General: Patient appears comfortable at rest. HEENT: Conjunctiva and lids normal, wearing a mask. Cardiac: Regular rate and rhythm, no S3 or significant systolic murmur, no pericardial rub.  ECG:  An ECG dated 09/13/2020 was personally reviewed today and demonstrated:  Normal sinus rhythm.  Recent Labwork: 09/18/2020: BUN 16; Creatinine, Ser 1.00; Hemoglobin 13.8; Platelets 192; Potassium 4.1; Sodium 138  November 2021: Cholesterol 202, triglycerides 203, LDL 122, HDL 40, TSH 2.57  Other Studies Reviewed Today:  Cardiac catheterization 09/20/2020:  2nd Diag lesion is 25% stenosed.  Mid LAD lesion is 10% stenosed.  The left ventricular systolic function is normal.  LV end diastolic pressure is normal.  The left ventricular ejection fraction is 55-65% by visual estimate.  There is no aortic valve stenosis.   Minimal, nonobstructive CAD.  Continue preventive therapy.   Tortuous right subclavian.  If PCI was ever needed in the future, may need a long, braided sheath for the subclavian artery.   Assessment and Plan:  1.  Mild coronary atherosclerosis by recent cardiac catheterization in February.  Recommend risk factor modification including diet and exercise.  He is currently on aspirin, Toprol-XL, and losartan.  He has multistatin intolerance, we will initiate Zetia 10 mg daily and recheck FLP for 52-month visit.  2.  Essential hypertension, blood pressure is well controlled today.  No  changes were made.  Medication Adjustments/Labs and Tests Ordered: Current medicines are reviewed at length with the patient today.  Concerns regarding medicines are outlined above.   Tests Ordered: Orders Placed This Encounter  Procedures  . Lipid panel    Medication Changes: Meds ordered this encounter  Medications  . ezetimibe (ZETIA) 10 MG tablet    Sig: Take 1 tablet (10 mg total) by mouth daily.    Dispense:  90 tablet    Refill:  1    10/23/2020 NEW    Disposition:  Follow up 6 months in the [place] office.  Signed, Jonelle Sidle, MD, [facility] 10/23/2020 1:39 PM  [facility] [place], [place], [place] [address] Phone: [phone]; Fax: [phone]

## 2020-11-19 ENCOUNTER — Encounter (INDEPENDENT_AMBULATORY_CARE_PROVIDER_SITE_OTHER): Payer: Medicare Other | Admitting: Ophthalmology

## 2020-11-26 ENCOUNTER — Encounter (INDEPENDENT_AMBULATORY_CARE_PROVIDER_SITE_OTHER): Payer: Medicare Other | Admitting: Ophthalmology

## 2021-01-17 NOTE — Progress Notes (Signed)
[facility] - Clinic Note  01/21/2021     CHIEF COMPLAINT Patient presents for Retina Follow Up   HISTORY OF PRESENT ILLNESS: Timothy Pruitt is a 75 y.o. male who presents to the clinic today for:   HPI     Retina Follow Up   Patient presents with  PVD.  In right eye.  Duration of 3 months.  Since onset it is stable.  I, the attending physician,  performed the HPI with the patient and updated documentation appropriately.        Comments   Pt here for 3 month retinal follow up PVD OD, ARMD OD. Pt states no vision changes, no ocular complaints or issues. Most recent A1C was 7.1.       Last edited by Rennis Chris, MD on 01/21/2021  3:50 PM.     pt states he is having left eye cataract sx on Thursday with a dr from [place], his right eye is scheduled for end of July   Referring physician: Theodoro Kos, MD [id number] [place],  [place] [address]  HISTORICAL INFORMATION:   Selected notes from the MEDICAL RECORD NUMBER Referred by Dr. [name] for concern of exudative ARMD OD   CURRENT MEDICATIONS: No current outpatient medications on file. (Ophthalmic Drugs)   No current facility-administered medications for this visit. (Ophthalmic Drugs)   Current Outpatient Medications (Other)  Medication Sig   aspirin EC 81 MG tablet Take 81 mg by mouth daily.   busPIRone (BUSPAR) 15 MG tablet Take 15 mg by mouth 3 (three) times daily.   doxazosin (CARDURA) 4 MG tablet Take 4 mg by mouth daily.   DULoxetine (CYMBALTA) 30 MG capsule Take 30 mg by mouth daily.   ezetimibe (ZETIA) 10 MG tablet Take 1 tablet (10 mg total) by mouth daily.   Fingerstix Lancets MISC Inject into the skin.   glucose blood (ACCU-CHEK AVIVA PLUS) test strip 1 strip by external route every day   losartan (COZAAR) 100 MG tablet Take 100 mg by mouth daily.   metoprolol succinate (TOPROL-XL) 25 MG 24 hr tablet Take 25 mg by mouth every morning.   Multiple Vitamins-Minerals  (PRESERVISION AREDS 2 PO) Take 1 tablet by mouth in the morning and at bedtime.   Omega-3 Fatty Acids (KP FISH OIL) 1200 MG CAPS Take 3,600 capsules by mouth daily.   sildenafil (REVATIO) 20 MG tablet Take 20 mg by mouth daily as needed (ED).   Current Facility-Administered Medications (Other)  Medication Route   Bevacizumab (AVASTIN) SOLN 1.25 mg Intravitreal   Bevacizumab (AVASTIN) SOLN 1.25 mg Intravitreal   Bevacizumab (AVASTIN) SOLN 1.25 mg Intravitreal   Bevacizumab (AVASTIN) SOLN 1.25 mg Intravitreal   Bevacizumab (AVASTIN) SOLN 1.25 mg Intravitreal   Bevacizumab (AVASTIN) SOLN 1.25 mg Intravitreal   Bevacizumab (AVASTIN) SOLN 1.25 mg Intravitreal   sodium chloride flush (NS) 0.9 % injection 3 mL Intravenous      REVIEW OF SYSTEMS: ROS   Positive for: Genitourinary, Endocrine, Eyes, Psychiatric Negative for: Constitutional, Gastrointestinal, Neurological, Skin, Musculoskeletal, HENT, Cardiovascular, Respiratory, Allergic/Imm, Heme/Lymph Last edited by Thompson Grayer, COT on 01/21/2021  1:50 PM.        ALLERGIES Allergies  Allergen Reactions   Statins Other (See Comments)    Muscle pain   Sulfa Antibiotics Hives   Trazodone And Nefazodone Swelling    PAST MEDICAL HISTORY Past Medical History:  Diagnosis Date   Aortic atherosclerosis (HCC)    CT imaging   Cataract  Combined forms OU   Depression    Essential hypertension    Macular degeneration    Wet OD, Dry OS   Mixed hyperlipidemia    Nephrolithiasis    Status post lithotripsy   Right arm fracture    Age 64   Statin intolerance    Type 2 diabetes mellitus (HCC)    Past Surgical History:  Procedure Laterality Date   APPENDECTOMY     COLONOSCOPY     LEFT HEART CATH AND CORONARY ANGIOGRAPHY N/A 09/20/2020   Procedure: LEFT HEART CATH AND CORONARY ANGIOGRAPHY;  Surgeon: Corky Crafts, MD;  [facility];  Service: Cardiovascular;  Laterality: N/A;   Skin cancer resection      TONSILLECTOMY      FAMILY HISTORY Family History  Problem Relation Age of Onset   Cataracts Mother    Fuch's dystrophy Mother    Glaucoma Mother    Stroke Mother    Cancer Mother    Diabetes Brother    CAD Father    Arthritis Father     SOCIAL HISTORY Social History   Tobacco Use   Smoking status: Former    Pack years: 0.00    Types: Cigarettes   Smokeless tobacco: Never   Tobacco comments:    Quit 1995  Vaping Use   Vaping Use: Never used  Substance Use Topics   Alcohol use: Yes   Drug use: Never         OPHTHALMIC EXAM:  Base Eye Exam     Visual Acuity (Snellen - Linear)       Right Left   Dist cc 20/25 20/30 -1   Dist ph cc  NI    Correction: Glasses         Tonometry (Tonopen, 1:58 PM)       Right Left   Pressure 17 19         Pupils       Dark Light Shape React APD   Right 3 2 Round Brisk None   Left 3 2 Round Brisk None         Visual Fields (Counting fingers)       Left Right    Full Full         Extraocular Movement       Right Left    Full, Ortho Full, Ortho         Neuro/Psych     Oriented x3: Yes   Mood/Affect: Normal         Dilation     Both eyes: 1.0% Mydriacyl, 2.5% Phenylephrine @ 1:58 PM           Slit Lamp and Fundus Exam     Slit Lamp Exam       Right Left   Lids/Lashes Dermatochalasis - upper lid Dermatochalasis - upper lid   Conjunctiva/Sclera White and quiet White and quiet   Cornea Arcus, trace tear film debris Arcus, trace tear film debris, trace Punctate epithelial erosions   Anterior Chamber Deep and quiet Deep and quiet   Iris Round and dilated Round and dilated   Lens 2-3+ Nuclear sclerosis with early brunescence, 2-3+ Cortical cataract 2-3+ Nuclear sclerosis with brunescence, 3+ Cortical cataract   Vitreous Vitreous syneresis, Posterior vitreous detachment, vitreous condensations, vitreous debris, Weiss ring Vitreous syneresis         Fundus Exam       Right Left    Disc Compact, mild Temporal Peripapillary atrophy, Pink  and Sharp Pink and Sharp, mild Temporal Peripapillary atrophy, Compact   C/D Ratio 0.1 0.2   Macula Blunted foveal reflex, central PED, central pigment clumping, RPE mottling and clumping, Drusen, no heme Blunted foveal reflex, Epiretinal membrane, RPE mottling /  Clumping / atophy, Drusen, No heme or edema, patch of RPE atrophy IT macula, stable   Vessels attenuated, mild tortuousity attenuated, mild tortuousity   Periphery Attached, scattered RPE changes, no heme, peripheral drusen, mild reticular degeneration, no RT/RD  Attached, scattered peripheral drusen, No RT/RD           Refraction     Wearing Rx       Sphere Cylinder Axis   Right -3.25 +0.50 016   Left -2.75 Sphere     Type: SVL            IMAGING AND PROCEDURES  Imaging and Procedures for @TODAY @  OCT, Retina - OU - Both Eyes       Right Eye Quality was good. Central Foveal Thickness: 323. Progression has been stable. Findings include abnormal foveal contour, no IRF, pigment epithelial detachment, retinal drusen , no SRF, epiretinal membrane, myopic contour (Stable central PED w/ stable improvement in overlying ellipsoid signal. ).   Left Eye Quality was good. Central Foveal Thickness: 309. Progression has been stable. Findings include normal foveal contour, epiretinal membrane, retinal drusen , pigment epithelial detachment, outer retinal atrophy, no SRF, no IRF, inner retinal atrophy (Persistent ERM; Stable patch of ORA and overlying atrophy IT macula).   Notes *Images captured and stored on drive  Diagnosis / Impression:  OD: Exudative ARMD - no IRF/SRF; stable central PED.  OS: Non- Exudative ARMD with early atrophy; ERM -- persistent; Stable patch of ORA and overlying atrophy IT macula   Clinical management:  See below  Abbreviations: NFP - Normal foveal profile. CME - cystoid macular edema. PED - pigment epithelial detachment. IRF - intraretinal  fluid. SRF - subretinal fluid. EZ - ellipsoid zone. ERM - epiretinal membrane. ORA - outer retinal atrophy. ORT - outer retinal tubulation. SRHM - subretinal hyper-reflective material                ASSESSMENT/PLAN:    ICD-10-CM   1. Exudative age-related macular degeneration of right eye with active choroidal neovascularization (HCC)  H35.3211     2. Retinal edema  H35.81 OCT, Retina - OU - Both Eyes    3. Intermediate stage nonexudative age-related macular degeneration of left eye  H35.3122     4. Epiretinal membrane (ERM) of left eye  H35.372     5. Posterior vitreous detachment of right eye  H43.811     6. Combined forms of age-related cataract of both eyes  H25.813        1,2. Exudative age related macular degeneration, OD  - at initial presentation, pt endorsed metamorphopsia   - OCT OD with PED and overlying SRF + drusen  - S/P IVA OD #1 (05.08.19) early resolution, #2 (06.06.19) #3 (07.03.19), #4 (08.13.19), #5 (10.08.19), #6 (12.13.19), #7 (03.12.20),#8 (07.02.20), #9 (10.20.20), #10 (02.15.21), #11 (06.16.21)  - today, pt reports stable resolution of metamorphopsia   - OCT shows stable resolution of SRF and stable PED despite no IVA since 06.16.21  - BCVA stable at 20/25   - exam stable without heme  - recommend holding IVA OD today  - pt in agreement  - f/u in 6 months, sooner prn -- DFE/OCT  3. Age related macular degeneration, non-exudative, OS  -  The incidence, anatomy, and pathology of dry AMD, risk of progression, and the AREDS and AREDS 2 study including smoking risks discussed with patient.  - continue amsler grid monitoring  4. Epiretinal membrane, OS  - mild ERM  - asymptomatic, no metamorphopsia  - no indication for surgery at this time  - monitor for now  - f/u in 6 months  5. PVD OD  - today, flashes/floaters symptoms improved OD  - OCT shows stable release of PVD OD, OS with partial PVD still attached to disc  - Discussed findings and  prognosis  - concern for impending PVD OS  - No RT or RD on repeat 360 peripheral exam OU  - Reviewed s/s of RT/RD  - Strict return precautions for any such RT/RD signs/symptoms  - f/u in 6 months, sooner prn -- DFE/OCT  6. Combined form age-related cataract OU-   - The symptoms of cataract, surgical options, and treatments and risks were discussed with patient.  - approaching visual significance  - now under the expert management of Dr. Anner CreteWells? at [facility]?  - [place]  Ophthalmic Meds Ordered this visit:  No orders of the defined types were placed in this encounter.      Return in about 6 months (around 07/23/2021) for f/u exu ARMD OD, DFE, OCT.  There are no Patient Instructions on file for this visit.  This document serves as a record of services personally performed by Karie ChimeraBrian G. Nisha Dhami, MD, PhD. It was created on their behalf by Herby AbrahamAshley English, COA, an ophthalmic technician. The creation of this record is the provider's dictation and/or activities during the visit.    Electronically signed by: Herby AbrahamAshley English, COA @TODAY @ 4:10 PM  This document serves as a record of services personally performed by Karie ChimeraBrian G. Keifer Habib, MD, PhD. It was created on their behalf by Glee ArvinAmanda J. Manson PasseyBrown, OA an ophthalmic technician. The creation of this record is the provider's dictation and/or activities during the visit.    Electronically signed by: Glee ArvinAmanda J. Manson PasseyBrown, [place] 06.20.2022 4:10 PM  Karie ChimeraBrian G. Casin Federici, M.D., Ph.D. Diseases & Surgery of the Retina and Vitreous [facility] 01/21/2021   I have reviewed the above documentation for accuracy and completeness, and I agree with the above. Karie ChimeraBrian G. Sheena Simonis, M.D., Ph.D. 01/21/21 4:10 PM  Abbreviations: M myopia (nearsighted); A astigmatism; H hyperopia (farsighted); P presbyopia; Mrx spectacle prescription;  CTL contact lenses; OD right eye; OS left eye; OU both eyes  XT exotropia; ET  esotropia; PEK punctate epithelial keratitis; PEE punctate epithelial erosions; DES dry eye syndrome; MGD meibomian gland dysfunction; ATs artificial tears; PFAT's preservative free artificial tears; NSC nuclear sclerotic cataract; PSC posterior subcapsular cataract; ERM epi-retinal membrane; PVD posterior vitreous detachment; RD retinal detachment; DM diabetes mellitus; DR diabetic retinopathy; NPDR non-proliferative diabetic retinopathy; PDR proliferative diabetic retinopathy; CSME clinically significant macular edema; DME diabetic macular edema; dbh dot blot hemorrhages; CWS cotton wool spot; POAG primary open angle glaucoma; C/D cup-to-disc ratio; HVF humphrey visual field; GVF goldmann visual field; OCT optical coherence tomography; IOP intraocular pressure; BRVO Branch retinal vein occlusion; CRVO central retinal vein occlusion; CRAO central retinal artery occlusion; BRAO branch retinal artery occlusion; RT retinal tear; SB scleral buckle; PPV pars plana vitrectomy; VH Vitreous hemorrhage; PRP panretinal laser photocoagulation; IVK intravitreal kenalog; VMT vitreomacular traction; MH Macular hole;  NVD neovascularization of the disc; NVE neovascularization elsewhere; AREDS age related eye disease study; ARMD age related macular  degeneration; POAG primary open angle glaucoma; EBMD epithelial/anterior basement membrane dystrophy; ACIOL anterior chamber intraocular lens; IOL intraocular lens; PCIOL posterior chamber intraocular lens; Phaco/IOL phacoemulsification with intraocular lens placement; [place] photorefractive keratectomy; LASIK laser assisted in situ keratomileusis; HTN hypertension; DM diabetes mellitus; COPD chronic obstructive pulmonary disease

## 2021-01-21 ENCOUNTER — Ambulatory Visit (INDEPENDENT_AMBULATORY_CARE_PROVIDER_SITE_OTHER): Payer: Medicare Other | Admitting: Ophthalmology

## 2021-01-21 ENCOUNTER — Encounter (INDEPENDENT_AMBULATORY_CARE_PROVIDER_SITE_OTHER): Payer: Self-pay | Admitting: Ophthalmology

## 2021-01-21 ENCOUNTER — Other Ambulatory Visit: Payer: Self-pay

## 2021-01-21 DIAGNOSIS — H35372 Puckering of macula, left eye: Secondary | ICD-10-CM

## 2021-01-21 DIAGNOSIS — H353211 Exudative age-related macular degeneration, right eye, with active choroidal neovascularization: Secondary | ICD-10-CM | POA: Diagnosis not present

## 2021-01-21 DIAGNOSIS — H353122 Nonexudative age-related macular degeneration, left eye, intermediate dry stage: Secondary | ICD-10-CM | POA: Diagnosis not present

## 2021-01-21 DIAGNOSIS — H3581 Retinal edema: Secondary | ICD-10-CM

## 2021-01-21 DIAGNOSIS — H43811 Vitreous degeneration, right eye: Secondary | ICD-10-CM

## 2021-01-21 DIAGNOSIS — H25813 Combined forms of age-related cataract, bilateral: Secondary | ICD-10-CM

## 2021-01-23 ENCOUNTER — Encounter (INDEPENDENT_AMBULATORY_CARE_PROVIDER_SITE_OTHER): Payer: Medicare Other | Admitting: Ophthalmology

## 2021-04-26 ENCOUNTER — Ambulatory Visit: Payer: Medicare Other | Admitting: Cardiology

## 2021-04-29 ENCOUNTER — Ambulatory Visit: Payer: Medicare Other | Admitting: Family Medicine

## 2021-05-13 NOTE — Progress Notes (Signed)
Cardiology Office Note  Date: 05/14/2021   ID: Timothy Pruitt, DOB Jun 28, 1946, MRN [id number]  PCP:  Theodoro Kos, MD  Cardiologist:  Nona Dell, MD Electrophysiologist:  None   Chief Complaint: 82-month follow-up  History of Present Illness: Timothy Pruitt is a 75 y.o. male with a history of mild coronary artery disease, HTN, HLD, DM2, statin intolerance.  He was last seen by Dr. Diona Browner on 10/24/2020.  He had been seen in consultation in February 2022 for further cardiac evaluation following an abnormal myocardial perfusion study done through [facility] clinic in setting of cardiac risk factors with history of chest discomfort and DOE.  Underwent cardiac catheterization February 17 which demonstrated only mild coronary artery atherosclerosis with no obstructive lesions requiring revascularization.  Basic risk factor reduction was discussed along with potential medication adjustments.  History of multi statin intolerance.  His LDL was 122 at last check.  A trial of Zetia 10 mg was discussed.  He was continuing aspirin, Toprol-XL and losartan.  He had multi statin intolerance.  Plan was to initiate 10 mg of Zetia daily and recheck FLP's for 66-month follow-up.  Blood pressure was well controlled.  No medication changes were made.  Patient is here today for 54-month follow-up.  Patient states in the interim he had been feeling bad and had stopped some of the medications thinking one of them may be contributing to making him feel bad.  He now thinks it was the metformin that was making him feel bad.  He states he had temporarily stopped the Zetia but plans to start it back soon.  He brings with him some lab work from his primary care office drawn on 03/26/2020 demonstrating glucose of 156, creatinine 0.92, GFR 82.  Lipid panel shows total cholesterol 245, HDL 35.5, LDL 166.5, triglycerides 215.  Hemoglobin A1c is 6.2%.  We discussed referral to lipid Clinic for consideration of Repatha.  He asked  for phone number of the lipid clinic and states he will call us when he decides to be referred.  Blood pressures well controlled today.  Denies any anginal symptoms           Past Medical History:  Diagnosis Date   Aortic atherosclerosis (HCC)    CT imaging   Cataract    Combined forms OU   Depression    Essential hypertension    Macular degeneration    Wet OD, Dry OS   Mixed hyperlipidemia    Nephrolithiasis    Status post lithotripsy   Right arm fracture    Age 34   Statin intolerance    Type 2 diabetes mellitus (HCC)     Past Surgical History:  Procedure Laterality Date   APPENDECTOMY     COLONOSCOPY     LEFT HEART CATH AND CORONARY ANGIOGRAPHY N/A 09/20/2020   Procedure: LEFT HEART CATH AND CORONARY ANGIOGRAPHY;  Surgeon: Corky Crafts, MD;  Location: [facility];  Service: Cardiovascular;  Laterality: N/A;   Skin cancer resection     TONSILLECTOMY      Current Outpatient Medications  Medication Sig Dispense Refill   aspirin EC 81 MG tablet Take 81 mg by mouth daily.     buPROPion (WELLBUTRIN SR) 150 MG 12 hr tablet Take 150 mg by mouth daily.     busPIRone (BUSPAR) 15 MG tablet Take 15 mg by mouth 3 (three) times daily.     doxazosin (CARDURA) 4 MG tablet Take 4 mg by mouth daily.  Fingerstix Lancets MISC Inject into the skin.     glucose blood (ACCU-CHEK AVIVA PLUS) test strip 1 strip by external route every day     losartan (COZAAR) 100 MG tablet Take 100 mg by mouth daily.  2   metFORMIN (GLUCOPHAGE) 500 MG tablet Take 500 mg by mouth 2 (two) times daily.     metoprolol succinate (TOPROL-XL) 25 MG 24 hr tablet Take 25 mg by mouth every morning.     Multiple Vitamins-Minerals (PRESERVISION AREDS 2 PO) Take 1 tablet by mouth in the morning and at bedtime.     Omega-3 Fatty Acids (KP FISH OIL) 1200 MG CAPS Take 3,600 capsules by mouth daily.     sildenafil (REVATIO) 20 MG tablet Take 20 mg by mouth daily as needed (ED).     ezetimibe (ZETIA) 10 MG  tablet Take 1 tablet (10 mg total) by mouth daily.     Current Facility-Administered Medications  Medication Dose Route Frequency Provider Last Rate Last Admin   Bevacizumab (AVASTIN) SOLN 1.25 mg  1.25 mg Intravitreal  Rennis Chris, MD   1.25 mg at 12/12/17 1452   Bevacizumab (AVASTIN) SOLN 1.25 mg  1.25 mg Intravitreal  Rennis Chris, MD   1.25 mg at 01/07/18 1109   Bevacizumab (AVASTIN) SOLN 1.25 mg  1.25 mg Intravitreal  Rennis Chris, MD   1.25 mg at 02/03/18 1141   Bevacizumab (AVASTIN) SOLN 1.25 mg  1.25 mg Intravitreal  Rennis Chris, MD   1.25 mg at 03/16/18 1219   Bevacizumab (AVASTIN) SOLN 1.25 mg  1.25 mg Intravitreal  Rennis Chris, MD   1.25 mg at 05/11/18 1309   Bevacizumab (AVASTIN) SOLN 1.25 mg  1.25 mg Intravitreal  Rennis Chris, MD   1.25 mg at 07/19/18 1357   Bevacizumab (AVASTIN) SOLN 1.25 mg  1.25 mg Intravitreal  Rennis Chris, MD   1.25 mg at 10/15/18 1652   sodium chloride flush (NS) 0.9 % injection 3 mL  3 mL Intravenous Q12H Jonelle Sidle, MD       Allergies:  Statins, Sulfa antibiotics, and Trazodone and nefazodone   Social History: The patient  reports that he has quit smoking. His smoking use included cigarettes. He has never used smokeless tobacco. He reports current alcohol use. He reports that he does not use drugs.   Family History: The patient's family history includes Arthritis in his father; CAD in his father; Cancer in his mother; Cataracts in his mother; Diabetes in his brother; Fuch's dystrophy in his mother; Glaucoma in his mother; Stroke in his mother.   ROS:  Please see the history of present illness. Otherwise, complete review of systems is positive for none.  All other systems are reviewed and negative.   Physical Exam: VS:  BP 124/82   Pulse 92   Ht 5\' 8"  (1.727 m)   Wt 158 lb 12.8 oz (72 kg)   SpO2 97%   BMI 24.15 kg/m , BMI Body mass index is 24.15 kg/m.  Wt Readings from Last 3 Encounters:  05/14/21 158 lb 12.8 oz (72 kg)   10/23/20 176 lb 12.8 oz (80.2 kg)  09/20/20 175 lb (79.4 kg)    General: Patient appears comfortable at rest. Neck: Supple, no elevated JVP or carotid bruits, no thyromegaly. Lungs: Clear to auscultation, nonlabored breathing at rest. Cardiac: Regular rate and rhythm, no S3 or significant systolic murmur, no pericardial rub. Extremities: No pitting edema, distal pulses 2+. Skin: Warm and dry. Musculoskeletal: No kyphosis. Neuropsychiatric: Alert and  oriented x3, affect grossly appropriate.  ECG:    Recent Labwork: 09/18/2020: BUN 16; Creatinine, Ser 1.00; Hemoglobin 13.8; Platelets 192; Potassium 4.1; Sodium 138  No results found for: CHOL, TRIG, HDL, CHOLHDL, VLDL, LDLCALC, LDLDIRECT  Other Studies Reviewed Today:   Cardiac catheterization 09/20/2020: 2nd Diag lesion is 25% stenosed. Mid LAD lesion is 10% stenosed. The left ventricular systolic function is normal. LV end diastolic pressure is normal. The left ventricular ejection fraction is 55-65% by visual estimate. There is no aortic valve stenosis.   Minimal, nonobstructive CAD.  Continue preventive therapy.    Tortuous right subclavian.  If PCI was ever needed in the future, may need a long, braided sheath for the subclavian artery.     Assessment and Plan:  1. Mild coronary artery disease   2. Essential hypertension   3. Mixed hyperlipidemia    1. Mild coronary artery disease Denies any anginal symptoms.  Continue aspirin 81 mg daily.  Continue Toprol-XL 25 mg daily.  2. Essential hypertension Blood pressure well controlled today at 124/82.  He states his blood pressure is usually better at home.  Continue losartan 100 mg p.o. daily.  Continue Toprol-XL 25 mg daily.  3. Mixed hyperlipidemia Recent lipid panel from his primary care office on 03/26/2021: Total cholesterol 245, HDL 35.5, LDL 166.5.  Triglycerides 215.  He had stopped Zetia temporarily due to feeling bad and suspected one of the medications is causing  him to feel bad.  He states he has been gradually adding back medications.  He states the reason he was feeling bad may have been due to the metformin.  He plans to start back Zetia.  We discussed referral to lipid clinic for possible initiation of Repatha.  He would like [place] to give him the information regarding location and phone number and he will let us know when he is ready for referral.  Recent lab results from PCP will be scanned into epic.  4.  Diabetes Recent hemoglobin A1c was 6.2% with a random glucose of 156.  He is continuing metformin prescribed by PCP  Medication Adjustments/Labs and Tests Ordered: Current medicines are reviewed at length with the patient today.  Concerns regarding medicines are outlined above.   Disposition: Follow-up with Dr. Diona Browner or APP 6 months  Signed, Rennis Harding, NP 05/14/2021 2:05 PM    [facility] [address] [place], [place], [place] [address] Phone: [phone]; Fax: [phone]

## 2021-05-14 ENCOUNTER — Encounter: Payer: Self-pay | Admitting: Family Medicine

## 2021-05-14 ENCOUNTER — Ambulatory Visit (INDEPENDENT_AMBULATORY_CARE_PROVIDER_SITE_OTHER): Payer: Medicare Other | Admitting: Family Medicine

## 2021-05-14 VITALS — BP 124/82 | HR 92 | Ht 68.0 in | Wt 158.8 lb

## 2021-05-14 DIAGNOSIS — I2510 Atherosclerotic heart disease of native coronary artery without angina pectoris: Secondary | ICD-10-CM

## 2021-05-14 DIAGNOSIS — E782 Mixed hyperlipidemia: Secondary | ICD-10-CM | POA: Diagnosis not present

## 2021-05-14 DIAGNOSIS — I10 Essential (primary) hypertension: Secondary | ICD-10-CM

## 2021-05-14 MED ORDER — EZETIMIBE 10 MG PO TABS: 10.0000 mg | ORAL_TABLET | Freq: Every day | ORAL | Status: DC

## 2021-05-14 NOTE — Patient Instructions (Addendum)
Medication Instructions:  Continue all current medications.  Labwork: none  Testing/Procedures: none  Follow-Up: Your physician wants you to follow up in: 6 months.  You will receive a reminder letter in the mail one-two months in advance.  If you don't receive a letter, please call our office to schedule the follow up appointment   Any Other Special Instructions Will Be Listed Below (If Applicable). Lipid Clinic Dr. Zoila Shutter [facility] at [facility] [address] [place], [place]  [address]  If you need a refill on your cardiac medications before your next appointment, please call your pharmacy.

## 2021-07-01 NOTE — Progress Notes (Signed)
[facility] - Clinic Note  07/03/2021     CHIEF COMPLAINT Patient presents for Retina Follow Up   HISTORY OF PRESENT ILLNESS: Timothy Pruitt is a 75 y.o. male who presents to the clinic today for:   HPI     Retina Follow Up   Patient presents with  Wet AMD.  In right eye.  This started 6 months ago.  I, the attending physician,  performed the HPI with the patient and updated documentation appropriately.        Comments   Patient here for 6 months retina follow up for exu ARMD OD. Patient states vision doing ok. No eye pain.       Last edited by Timothy Chris, MD on 07/05/2021  8:35 PM.    Pt states he had cataract sx in July/August and wishes he did not have it, he states he thought he would be able to read better, but he actually reads worse, he states the glare at night only improved 25% so he doesn't think the sx was worth it, he states when he is reading or watching TV his right eye becomes blurry and clears on it's own, he states the sx stirred up a lot of floaters in the right eye   Referring physician: Theodoro Kos, MD [id number] [facility] [address] [place],  [place] [address]  HISTORICAL INFORMATION:   Selected notes from the MEDICAL RECORD NUMBER Referred by Dr. Jonny Pruitt for concern of exudative ARMD OD   CURRENT MEDICATIONS: No current outpatient medications on file. (Ophthalmic Drugs)   No current facility-administered medications for this visit. (Ophthalmic Drugs)   Current Outpatient Medications (Other)  Medication Sig   aspirin EC 81 MG tablet Take 81 mg by mouth daily.   buPROPion (WELLBUTRIN SR) 150 MG 12 hr tablet Take 150 mg by mouth daily.   busPIRone (BUSPAR) 15 MG tablet Take 15 mg by mouth 3 (three) times daily.   doxazosin (CARDURA) 4 MG tablet Take 4 mg by mouth daily.   ezetimibe (ZETIA) 10 MG tablet Take 1 tablet (10 mg total) by mouth daily.   Fingerstix Lancets MISC Inject into the skin.   glucose blood (ACCU-CHEK AVIVA PLUS)  test strip 1 strip by external route every day   losartan (COZAAR) 100 MG tablet Take 100 mg by mouth daily.   metFORMIN (GLUCOPHAGE) 500 MG tablet Take 500 mg by mouth 2 (two) times daily.   metoprolol succinate (TOPROL-XL) 25 MG 24 hr tablet Take 25 mg by mouth every morning.   Multiple Vitamins-Minerals (PRESERVISION AREDS 2 PO) Take 1 tablet by mouth in the morning and at bedtime.   Omega-3 Fatty Acids (KP FISH OIL) 1200 MG CAPS Take 3,600 capsules by mouth daily.   sildenafil (REVATIO) 20 MG tablet Take 20 mg by mouth daily as needed (ED).   Current Facility-Administered Medications (Other)  Medication Route   Bevacizumab (AVASTIN) SOLN 1.25 mg Intravitreal   Bevacizumab (AVASTIN) SOLN 1.25 mg Intravitreal   Bevacizumab (AVASTIN) SOLN 1.25 mg Intravitreal   Bevacizumab (AVASTIN) SOLN 1.25 mg Intravitreal   Bevacizumab (AVASTIN) SOLN 1.25 mg Intravitreal   Bevacizumab (AVASTIN) SOLN 1.25 mg Intravitreal   Bevacizumab (AVASTIN) SOLN 1.25 mg Intravitreal   sodium chloride flush (NS) 0.9 % injection 3 mL Intravenous   REVIEW OF SYSTEMS: ROS   Positive for: Genitourinary, Endocrine, Eyes, Psychiatric Negative for: Constitutional, Gastrointestinal, Neurological, Skin, Musculoskeletal, HENT, Cardiovascular, Respiratory, Allergic/Imm, Heme/Lymph Last edited by Timothy Pruitt, COA on 07/03/2021 12:45  PM.     ALLERGIES Allergies  Allergen Reactions   Statins Other (See Comments)    Muscle pain   Sulfa Antibiotics Hives   Trazodone And Nefazodone Swelling    PAST MEDICAL HISTORY Past Medical History:  Diagnosis Date   Aortic atherosclerosis (HCC)    CT imaging   Cataract    Combined forms OU   Depression    Essential hypertension    Macular degeneration    Wet OD, Dry OS   Mixed hyperlipidemia    Nephrolithiasis    Status post lithotripsy   Right arm fracture    Age 89   Statin intolerance    Type 2 diabetes mellitus (HCC)    Past Surgical History:  Procedure  Laterality Date   APPENDECTOMY     COLONOSCOPY     LEFT HEART CATH AND CORONARY ANGIOGRAPHY N/A 09/20/2020   Procedure: LEFT HEART CATH AND CORONARY ANGIOGRAPHY;  Surgeon: Timothy Crafts, MD;  Location: [facility] INVASIVE [facility];  Service: Cardiovascular;  Laterality: N/A;   Skin cancer resection     TONSILLECTOMY     FAMILY HISTORY Family History  Problem Relation Age of Onset   Cataracts Mother    Fuch's dystrophy Mother    Glaucoma Mother    Stroke Mother    Cancer Mother    Diabetes Brother    CAD Father    Arthritis Father    SOCIAL HISTORY Social History   Tobacco Use   Smoking status: Former    Types: Cigarettes   Smokeless tobacco: Never   Tobacco comments:    Quit 1995  Vaping Use   Vaping Use: Never used  Substance Use Topics   Alcohol use: Yes   Drug use: Never       OPHTHALMIC EXAM:  Base Eye Exam     Visual Acuity (Snellen - Linear)       Right Left   Dist cc 20/20 20/30 -2   Dist ph cc  NI    Correction: Glasses         Tonometry (Tonopen, 12:43 PM)       Right Left   Pressure 09 06         Pupils       Dark Light Shape React APD   Right 3 2 Round Brisk None   Left 3 2 Round Brisk None         Visual Fields (Counting fingers)       Left Right    Full Full         Extraocular Movement       Right Left    Full, Ortho Full, Ortho         Neuro/Psych     Oriented x3: Yes   Mood/Affect: Normal         Dilation     Both eyes: 1.0% Mydriacyl, 2.5% Phenylephrine @ 12:42 PM           Slit Lamp and Fundus Exam     Slit Lamp Exam       Right Left   Lids/Lashes Dermatochalasis - upper lid Dermatochalasis - upper lid   Conjunctiva/Sclera White and quiet White and quiet   Cornea Arcus, well healed cataract wound, trace tear film debris Arcus, trace tear film debris, well healed cataract wound   Anterior Chamber Deep and quiet Deep and quiet   Iris Round and dilated Round and dilated   Lens PC IOL in good  position, trace  Posterior capsular opacification PCIOL   Anterior Vitreous Vitreous syneresis, Posterior vitreous detachment, vitreous condensations, vitreous debris, Weiss ring Vitreous syneresis         Fundus Exam       Right Left   Disc Compact, mild Temporal Peripapillary atrophy, Pink and Sharp Pink and Sharp, mild Temporal Peripapillary atrophy, Compact   C/D Ratio 0.1 0.2   Macula Blunted foveal reflex, central PED, central pigment clumping, RPE mottling and clumping, Drusen, no heme Blunted foveal reflex, Epiretinal membrane, RPE mottling /  Clumping / atophy, Drusen, No heme or edema, patch of RPE atrophy IT macula, stable   Vessels attenuated, mild tortuousity, AV crossing changes attenuated, mild tortuousity   Periphery Attached, scattered RPE changes, no heme, peripheral drusen, mild reticular degeneration, no RT/RD Attached, scattered peripheral drusen, No RT/RD, no heme           Refraction     Wearing Rx       Sphere Cylinder Axis   Right -3.25 +0.50 016   Left -2.75 Sphere     Type: SVL            IMAGING AND PROCEDURES  Imaging and Procedures for @TODAY @  OCT, Retina - OU - Both Eyes       Right Eye Quality was good. Central Foveal Thickness: 334. Progression has been stable. Findings include abnormal foveal contour, no IRF, pigment epithelial detachment, retinal drusen , no SRF, epiretinal membrane, myopic contour (Stable central PED w/ stable improvement in overlying ellipsoid signal, mild progression of ERM).   Left Eye Quality was good. Central Foveal Thickness: 275. Progression has been stable. Findings include normal foveal contour, epiretinal membrane, retinal drusen , pigment epithelial detachment, outer retinal atrophy, no SRF, no IRF, inner retinal atrophy (Persistent ERM; Stable patch of ORA and overlying atrophy IT macula).   Notes *Images captured and stored on drive  Diagnosis / Impression:  OD: Exudative ARMD - no IRF/SRF; stable  central PED.  OS: Non- Exudative ARMD with early atrophy; ERM -- persistent; Stable patch of ORA and overlying atrophy IT macula   Clinical management:  See below  Abbreviations: NFP - Normal foveal profile. CME - cystoid macular edema. PED - pigment epithelial detachment. IRF - intraretinal fluid. SRF - subretinal fluid. EZ - ellipsoid zone. ERM - epiretinal membrane. ORA - outer retinal atrophy. ORT - outer retinal tubulation. SRHM - subretinal hyper-reflective material             ASSESSMENT/PLAN:    ICD-10-CM   1. Exudative age-related macular degeneration of right eye with active choroidal neovascularization (HCC)  H35.3211     2. Retinal edema  H35.81 OCT, Retina - OU - Both Eyes    3. Intermediate stage nonexudative age-related macular degeneration of left eye  H35.3122     4. Epiretinal membrane (ERM) of left eye  H35.372     5. Posterior vitreous detachment of right eye  H43.811     6. Pseudophakia, both eyes  Z96.1       1,2. Exudative age related macular degeneration, OD  - at initial presentation, pt endorsed metamorphopsia   - OCT OD with PED and overlying SRF + drusen  - S/P IVA OD #1 (05.08.19) early resolution, #2 (06.06.19) #3 (07.03.19), #4 (08.13.19), #5 (10.08.19), #6 (12.13.19), #7 (03.12.20),#8 (07.02.20), #9 (10.20.20), #10 (02.15.21), #11 (06.16.21)  - today, pt reports stable resolution of metamorphopsia   - OCT shows stable resolution of SRF and stable PED despite no IVA since  06.16.21  - BCVA 20/20 OD  - exam stable without heme  - recommend holding IVA OD today  - pt in agreement  - f/u in 6 months, sooner prn -- DFE/OCT  3. Age related macular degeneration, non-exudative, OS  - The incidence, anatomy, and pathology of dry AMD, risk of progression, and the AREDS and AREDS 2 study including smoking risks discussed with patient.  - continue amsler grid monitoring  4. Epiretinal membrane, OS  - mild ERM  - asymptomatic, no metamorphopsia  -  no indication for surgery at this time  - monitor for now  - f/u in 6 months  5. PVD OD  - flashes/floaters symptoms improved OD  - OCT shows stable release of PVD OD, OS with partial PVD still attached to disc  - Discussed findings and prognosis  - concern for impending PVD OS  - No RT or RD on repeat 360 peripheral exam OU  - Reviewed s/s of RT/RD  - Strict return precautions for any such RT/RD signs/symptoms  - f/u in 6 months, sooner prn -- DFE/OCT  6. Pseudophakia OU  - s/p CE/IOL (Summer '22, [facility])  - IOL in good position, doing well  - monitor   Ophthalmic Meds Ordered this visit:  No orders of the defined types were placed in this encounter.    Return for f/u 6-9 months, non-exu ARMD OU, DFE, OCT.  There are no Patient Instructions on file for this visit.  This document serves as a record of services personally performed by Karie Chimera, MD, PhD. It was created on their behalf by De Blanch, an ophthalmic technician. The creation of this record is the provider's dictation and/or activities during the visit.    Electronically signed by: De Blanch, OA, 07/05/21  8:47 PM  This document serves as a record of services personally performed by Karie Chimera, MD, PhD. It was created on their behalf by Glee Arvin. Manson Passey, OA an ophthalmic technician. The creation of this record is the provider's dictation and/or activities during the visit.    Electronically signed by: Glee Arvin. Manson Passey, [place] 11.30.2022 8:47 PM  Karie Chimera, M.D., Ph.D. Diseases & Surgery of the Retina and Vitreous [facility]  I have reviewed the above documentation for accuracy and completeness, and I agree with the above. Karie Chimera, M.D., Ph.D. 07/05/21 8:47 PM  Abbreviations: M myopia (nearsighted); A astigmatism; H hyperopia (farsighted); P presbyopia; Mrx spectacle prescription;  CTL contact lenses; OD right eye; OS left eye; OU both eyes  XT  exotropia; ET esotropia; PEK punctate epithelial keratitis; PEE punctate epithelial erosions; DES dry eye syndrome; MGD meibomian gland dysfunction; ATs artificial tears; PFAT's preservative free artificial tears; NSC nuclear sclerotic cataract; PSC posterior subcapsular cataract; ERM epi-retinal membrane; PVD posterior vitreous detachment; RD retinal detachment; DM diabetes mellitus; DR diabetic retinopathy; NPDR non-proliferative diabetic retinopathy; PDR proliferative diabetic retinopathy; CSME clinically significant macular edema; DME diabetic macular edema; dbh dot blot hemorrhages; CWS cotton wool spot; POAG primary open angle glaucoma; C/D cup-to-disc ratio; HVF humphrey visual field; GVF goldmann visual field; OCT optical coherence tomography; IOP intraocular pressure; BRVO Branch retinal vein occlusion; CRVO central retinal vein occlusion; CRAO central retinal artery occlusion; BRAO branch retinal artery occlusion; RT retinal tear; SB scleral buckle; PPV pars plana vitrectomy; VH Vitreous hemorrhage; PRP panretinal laser photocoagulation; IVK intravitreal kenalog; VMT vitreomacular traction; MH Macular hole;  NVD neovascularization of the disc; NVE neovascularization elsewhere; AREDS age  related eye disease study; ARMD age related macular degeneration; POAG primary open angle glaucoma; EBMD epithelial/anterior basement membrane dystrophy; ACIOL anterior chamber intraocular lens; IOL intraocular lens; PCIOL posterior chamber intraocular lens; Phaco/IOL phacoemulsification with intraocular lens placement; [place] photorefractive keratectomy; LASIK laser assisted in situ keratomileusis; HTN hypertension; DM diabetes mellitus; COPD chronic obstructive pulmonary disease

## 2021-07-03 ENCOUNTER — Other Ambulatory Visit: Payer: Self-pay

## 2021-07-03 ENCOUNTER — Ambulatory Visit (INDEPENDENT_AMBULATORY_CARE_PROVIDER_SITE_OTHER): Payer: Medicare Other | Admitting: Ophthalmology

## 2021-07-03 ENCOUNTER — Encounter (INDEPENDENT_AMBULATORY_CARE_PROVIDER_SITE_OTHER): Payer: Self-pay | Admitting: Ophthalmology

## 2021-07-03 DIAGNOSIS — H35372 Puckering of macula, left eye: Secondary | ICD-10-CM | POA: Diagnosis not present

## 2021-07-03 DIAGNOSIS — H3581 Retinal edema: Secondary | ICD-10-CM

## 2021-07-03 DIAGNOSIS — H43811 Vitreous degeneration, right eye: Secondary | ICD-10-CM | POA: Diagnosis not present

## 2021-07-03 DIAGNOSIS — H353122 Nonexudative age-related macular degeneration, left eye, intermediate dry stage: Secondary | ICD-10-CM | POA: Diagnosis not present

## 2021-07-03 DIAGNOSIS — Z961 Presence of intraocular lens: Secondary | ICD-10-CM

## 2021-07-03 DIAGNOSIS — H353211 Exudative age-related macular degeneration, right eye, with active choroidal neovascularization: Secondary | ICD-10-CM | POA: Diagnosis not present

## 2021-07-05 ENCOUNTER — Encounter (INDEPENDENT_AMBULATORY_CARE_PROVIDER_SITE_OTHER): Payer: Self-pay | Admitting: Ophthalmology

## 2021-07-23 ENCOUNTER — Encounter (INDEPENDENT_AMBULATORY_CARE_PROVIDER_SITE_OTHER): Payer: Medicare Other | Admitting: Ophthalmology

## 2022-01-01 ENCOUNTER — Telehealth: Payer: Self-pay | Admitting: Cardiology

## 2022-01-01 NOTE — Telephone Encounter (Signed)
Patient said that he put in mychart to have an appt for his 6 month follow up visit but it put him way in October and that would be close to a year visit. Patient is upset because he did not receive no notification to schedule his 6 month visit and wants to know why he has to wait to October. Patient wants nurse to call him back asap to answer questions that he has

## 2022-01-13 NOTE — Progress Notes (Signed)
[facility] - Clinic Note  01/15/2022     CHIEF COMPLAINT Patient presents for Retina Follow Up  HISTORY OF PRESENT ILLNESS: Timothy Pruitt is a 76 y.o. male who presents to the clinic today for:   HPI     Retina Follow Up   Patient presents with  Wet AMD.  In right eye.  This started years ago.  Duration of 7 months.  Since onset it is stable.  I, the attending physician,  performed the HPI with the patient and updated documentation appropriately.        Comments   Patient feels that the vision has remained unchanged since his last visit 7 months ago. He states that he defiantly sees floaters but they have not gotten any worse.  His blood sugar was 130 and his A1c is 6.2.      Last edited by Rennis Chris, MD on 01/15/2022  1:16 PM.     Referring physician: Theodoro Kos, MD [id number] [place],  [place] [address]  HISTORICAL INFORMATION:   Selected notes from the MEDICAL RECORD NUMBER Referred by Dr. [name] for concern of exudative ARMD OD   CURRENT MEDICATIONS: No current outpatient medications on file. (Ophthalmic Drugs)   No current facility-administered medications for this visit. (Ophthalmic Drugs)   Current Outpatient Medications (Other)  Medication Sig   aspirin EC 81 MG tablet Take 81 mg by mouth daily.   buPROPion (WELLBUTRIN SR) 150 MG 12 hr tablet Take 150 mg by mouth daily.   busPIRone (BUSPAR) 15 MG tablet Take 15 mg by mouth 3 (three) times daily.   doxazosin (CARDURA) 4 MG tablet Take 4 mg by mouth daily.   Fingerstix Lancets MISC Inject into the skin.   glucose blood (ACCU-CHEK AVIVA PLUS) test strip 1 strip by external route every day   losartan (COZAAR) 100 MG tablet Take 100 mg by mouth daily.   metFORMIN (GLUCOPHAGE) 500 MG tablet Take 500 mg by mouth 2 (two) times daily.   metoprolol succinate (TOPROL-XL) 25 MG 24 hr tablet Take 25 mg by mouth every morning.   Multiple Vitamins-Minerals (PRESERVISION AREDS 2 PO)  Take 1 tablet by mouth in the morning and at bedtime.   Omega-3 Fatty Acids (KP FISH OIL) 1200 MG CAPS Take 3,600 capsules by mouth daily.   sildenafil (REVATIO) 20 MG tablet Take 20 mg by mouth daily as needed (ED).   ezetimibe (ZETIA) 10 MG tablet Take 1 tablet (10 mg total) by mouth daily.   Current Facility-Administered Medications (Other)  Medication Route   Bevacizumab (AVASTIN) SOLN 1.25 mg Intravitreal   Bevacizumab (AVASTIN) SOLN 1.25 mg Intravitreal   Bevacizumab (AVASTIN) SOLN 1.25 mg Intravitreal   Bevacizumab (AVASTIN) SOLN 1.25 mg Intravitreal   Bevacizumab (AVASTIN) SOLN 1.25 mg Intravitreal   Bevacizumab (AVASTIN) SOLN 1.25 mg Intravitreal   Bevacizumab (AVASTIN) SOLN 1.25 mg Intravitreal   sodium chloride flush (NS) 0.9 % injection 3 mL Intravenous   REVIEW OF SYSTEMS: ROS   Positive for: Genitourinary, Endocrine, Eyes, Psychiatric Negative for: Constitutional, Gastrointestinal, Neurological, Skin, Musculoskeletal, HENT, Cardiovascular, Respiratory, Allergic/Imm, Heme/Lymph Last edited by Julieanne Cotton, COT on 01/15/2022  1:06 PM.     ALLERGIES Allergies  Allergen Reactions   Statins Other (See Comments)    Muscle pain   Sulfa Antibiotics Hives   Trazodone And Nefazodone Swelling   PAST MEDICAL HISTORY Past Medical History:  Diagnosis Date   Aortic atherosclerosis (HCC)    CT imaging  Cataract    Combined forms OU   Depression    Essential hypertension    Macular degeneration    Wet OD, Dry OS   Mixed hyperlipidemia    Nephrolithiasis    Status post lithotripsy   Right arm fracture    Age 54   Statin intolerance    Type 2 diabetes mellitus (HCC)    Past Surgical History:  Procedure Laterality Date   APPENDECTOMY     COLONOSCOPY     LEFT HEART CATH AND CORONARY ANGIOGRAPHY N/A 09/20/2020   Procedure: LEFT HEART CATH AND CORONARY ANGIOGRAPHY;  Surgeon: Corky Crafts, MD;  Location: [facility];  Service: Cardiovascular;   Laterality: N/A;   Skin cancer resection     TONSILLECTOMY     FAMILY HISTORY Family History  Problem Relation Age of Onset   Cataracts Mother    Fuch's dystrophy Mother    Glaucoma Mother    Stroke Mother    Cancer Mother    Diabetes Brother    CAD Father    Arthritis Father    SOCIAL HISTORY Social History   Tobacco Use   Smoking status: Former    Types: Cigarettes   Smokeless tobacco: Never   Tobacco comments:    Quit 1995  Vaping Use   Vaping Use: Never used  Substance Use Topics   Alcohol use: Yes   Drug use: Never       OPHTHALMIC EXAM:  Base Eye Exam     Visual Acuity (Snellen - Linear)       Right Left   Dist cc 20/20 20/30 +2   Dist ph cc  NI    Correction: Glasses         Tonometry (Tonopen, 1:09 PM)       Right Left   Pressure 15 12         Pupils       Pupils Dark Light Shape React APD   Right PERRL 3 2 Round Brisk None   Left PERRL 3 2 Round Brisk None         Visual Fields       Left Right    Full Full         Extraocular Movement       Right Left    Full, Ortho Full, Ortho         Neuro/Psych     Oriented x3: Yes   Mood/Affect: Normal         Dilation     Both eyes: 1.0% Mydriacyl, 2.5% Phenylephrine @ 1:07 PM           Slit Lamp and Fundus Exam     Slit Lamp Exam       Right Left   Lids/Lashes Dermatochalasis - upper lid Dermatochalasis - upper lid   Conjunctiva/Sclera White and quiet White and quiet   Cornea Arcus, well healed cataract wound, trace tear film debris Arcus, trace tear film debris, well healed cataract wound   Anterior Chamber Deep and quiet Deep and quiet   Iris Round and dilated Round and dilated   Lens PC IOL in good position, trace Posterior capsular opacification PC IOL in good position, trace Posterior capsular opacification   Anterior Vitreous Vitreous syneresis, Posterior vitreous detachment, vitreous condensations, vitreous debris, Weiss ring Vitreous syneresis          Fundus Exam       Right Left   Disc Compact, mild Temporal Peripapillary atrophy,  Pink and Sharp Pink and Sharp, mild Temporal Peripapillary atrophy, Compact   C/D Ratio 0.1 0.2   Macula Blunted foveal reflex, central PED, central pigment clumping, RPE mottling and clumping, Drusen, no heme or edema Blunted foveal reflex, Epiretinal membrane, RPE mottling /  Clumping / atophy, Drusen, No heme or edema, patch of RPE atrophy IT macula -- stable   Vessels attenuated, mild tortuousity, mild AV crossing changes attenuated, mild tortuousity   Periphery Attached, scattered RPE changes, no heme, peripheral drusen, mild reticular degeneration, no RT/RD Attached, scattered peripheral drusen, No RT/RD, no heme           Refraction     Wearing Rx       Sphere Cylinder Axis   Right -3.25 +0.50 016   Left -2.75 Sphere     Type: SVL            IMAGING AND PROCEDURES  Imaging and Procedures for @TODAY @  OCT, Retina - OU - Both Eyes       Right Eye Quality was good. Central Foveal Thickness: 324. Progression has been stable. Findings include no IRF, no SRF, abnormal foveal contour, myopic contour, retinal drusen , epiretinal membrane, pigment epithelial detachment (Stable central PED w/ stable improvement in overlying ellipsoid signal, stable ERM).   Left Eye Quality was good. Central Foveal Thickness: 271. Progression has been stable. Findings include normal foveal contour, no IRF, no SRF, retinal drusen , epiretinal membrane, pigment epithelial detachment, inner retinal atrophy, outer retinal atrophy (Persistent ERM; Stable patch of ORA and overlying atrophy IT macula).   Notes *Images captured and stored on drive  Diagnosis / Impression:  OD: Exudative ARMD - no IRF/SRF; stable central PED.  OS: Non- Exudative ARMD with early atrophy; ERM -- persistent; Stable patch of ORA and overlying atrophy IT macula   Clinical management:  See below  Abbreviations: NFP - Normal foveal  profile. CME - cystoid macular edema. PED - pigment epithelial detachment. IRF - intraretinal fluid. SRF - subretinal fluid. EZ - ellipsoid zone. ERM - epiretinal membrane. ORA - outer retinal atrophy. ORT - outer retinal tubulation. SRHM - subretinal hyper-reflective material             ASSESSMENT/PLAN:    ICD-10-CM   1. Exudative age-related macular degeneration of right eye with active choroidal neovascularization (HCC)  H35.3211 OCT, Retina - OU - Both Eyes    2. Intermediate stage nonexudative age-related macular degeneration of left eye  H35.3122     3. Epiretinal membrane (ERM) of both eyes  H35.373     4. Posterior vitreous detachment of right eye  H43.811     5. Pseudophakia, both eyes  Z96.1      1. Exudative age related macular degeneration, OD  - at initial presentation, pt endorsed metamorphopsia   - OCT OD with PED and overlying SRF + drusen  - S/P IVA OD #1 (05.08.19) early resolution, #2 (06.06.19) #3 (07.03.19), #4 (08.13.19), #5 (10.08.19), #6 (12.13.19), #7 (03.12.20),#8 (07.02.20), #9 (10.20.20), #10 (02.15.21), #11 (06.16.21)  - pt reports stable resolution of metamorphopsia   - OCT shows stable resolution of SRF and stable PED despite no IVA since 06.16.21  - BCVA 20/20 OD  - exam stable without heme  - recommend holding IVA OD today  - pt in agreement  - f/u in 9 months, sooner prn -- DFE/OCT  2. Age related macular degeneration, non-exudative, OS  - The incidence, anatomy, and pathology of dry AMD, risk of progression, and  the AREDS and AREDS 2 study including smoking risks discussed with patient.  - cont amsler grid monitoring  3. Epiretinal membrane, OU  - mild ERM  - asymptomatic, no metamorphopsia  - no indication for surgery at this time  - monitor for now  - f/u in 9 months  4. PVD OD  - flashes/floaters symptoms improved OD  - OCT shows stable release of PVD OD, OS with partial PVD still attached to disc  - Discussed findings and  prognosis  - concern for impending PVD OS  - No RT or RD on repeat 360 peripheral exam OU  - Reviewed s/s of RT/RD  - Strict return precautions for any such RT/RD signs/symptoms  - f/u in 9 months, sooner prn -- DFE/OCT  5. Pseudophakia OU  - s/p CE/IOL (Summer '22, [facility])  - IOL in good position, doing well  - monitor  Ophthalmic Meds Ordered this visit:  No orders of the defined types were placed in this encounter.    Return in about 9 months (around 10/16/2022) for DFE, OCT.  There are no Patient Instructions on file for this visit.  This document serves as a record of services personally performed by Karie Chimera, MD, PhD. It was created on their behalf by De Blanch, an ophthalmic technician. The creation of this record is the provider's dictation and/or activities during the visit.    Electronically signed by: De Blanch, OA, 01/18/22  12:21 AM  This document serves as a record of services personally performed by Karie Chimera, MD, PhD. It was created on their behalf by Joni Reining, an ophthalmic technician. The creation of this record is the provider's dictation and/or activities during the visit.    Electronically signed by: Joni Reining COA, 01/18/22  12:21 AM  Karie Chimera, M.D., Ph.D. Diseases & Surgery of the Retina and Vitreous [facility]  I have reviewed the above documentation for accuracy and completeness, and I agree with the above. Karie Chimera, M.D., Ph.D. 01/18/22 12:22 AM   Abbreviations: M myopia (nearsighted); A astigmatism; H hyperopia (farsighted); P presbyopia; Mrx spectacle prescription;  CTL contact lenses; OD right eye; OS left eye; OU both eyes  XT exotropia; ET esotropia; PEK punctate epithelial keratitis; PEE punctate epithelial erosions; DES dry eye syndrome; MGD meibomian gland dysfunction; ATs artificial tears; PFAT's preservative free artificial tears; NSC nuclear sclerotic cataract; PSC  posterior subcapsular cataract; ERM epi-retinal membrane; PVD posterior vitreous detachment; RD retinal detachment; DM diabetes mellitus; DR diabetic retinopathy; NPDR non-proliferative diabetic retinopathy; PDR proliferative diabetic retinopathy; CSME clinically significant macular edema; DME diabetic macular edema; dbh dot blot hemorrhages; CWS cotton wool spot; POAG primary open angle glaucoma; C/D cup-to-disc ratio; HVF humphrey visual field; GVF goldmann visual field; OCT optical coherence tomography; IOP intraocular pressure; BRVO Branch retinal vein occlusion; CRVO central retinal vein occlusion; CRAO central retinal artery occlusion; BRAO branch retinal artery occlusion; RT retinal tear; SB scleral buckle; PPV pars plana vitrectomy; VH Vitreous hemorrhage; PRP panretinal laser photocoagulation; IVK intravitreal kenalog; VMT vitreomacular traction; MH Macular hole;  NVD neovascularization of the disc; NVE neovascularization elsewhere; AREDS age related eye disease study; ARMD age related macular degeneration; POAG primary open angle glaucoma; EBMD epithelial/anterior basement membrane dystrophy; ACIOL anterior chamber intraocular lens; IOL intraocular lens; PCIOL posterior chamber intraocular lens; Phaco/IOL phacoemulsification with intraocular lens placement; PRK photorefractive keratectomy; LASIK laser assisted in situ keratomileusis; HTN hypertension; DM diabetes mellitus; COPD chronic obstructive pulmonary disease

## 2022-01-15 ENCOUNTER — Encounter (INDEPENDENT_AMBULATORY_CARE_PROVIDER_SITE_OTHER): Payer: Self-pay | Admitting: Ophthalmology

## 2022-01-15 ENCOUNTER — Ambulatory Visit (INDEPENDENT_AMBULATORY_CARE_PROVIDER_SITE_OTHER): Payer: Medicare Other | Admitting: Ophthalmology

## 2022-01-15 DIAGNOSIS — H353211 Exudative age-related macular degeneration, right eye, with active choroidal neovascularization: Secondary | ICD-10-CM | POA: Diagnosis not present

## 2022-01-15 DIAGNOSIS — H353122 Nonexudative age-related macular degeneration, left eye, intermediate dry stage: Secondary | ICD-10-CM

## 2022-01-15 DIAGNOSIS — H35373 Puckering of macula, bilateral: Secondary | ICD-10-CM

## 2022-01-15 DIAGNOSIS — H43811 Vitreous degeneration, right eye: Secondary | ICD-10-CM

## 2022-01-15 DIAGNOSIS — H35372 Puckering of macula, left eye: Secondary | ICD-10-CM

## 2022-01-15 DIAGNOSIS — Z961 Presence of intraocular lens: Secondary | ICD-10-CM

## 2022-01-15 DIAGNOSIS — H25813 Combined forms of age-related cataract, bilateral: Secondary | ICD-10-CM

## 2022-03-25 IMAGING — US US RENAL
1 series · 14 of 25 positions shown · non-contrast
Comparison: None.

CLINICAL DATA: Inguinal pain kidney stones

EXAM:
RENAL / URINARY TRACT ULTRASOUND COMPLETE

[Series 1: us renal · 14 of 82 slices shown]
[im 1/82]
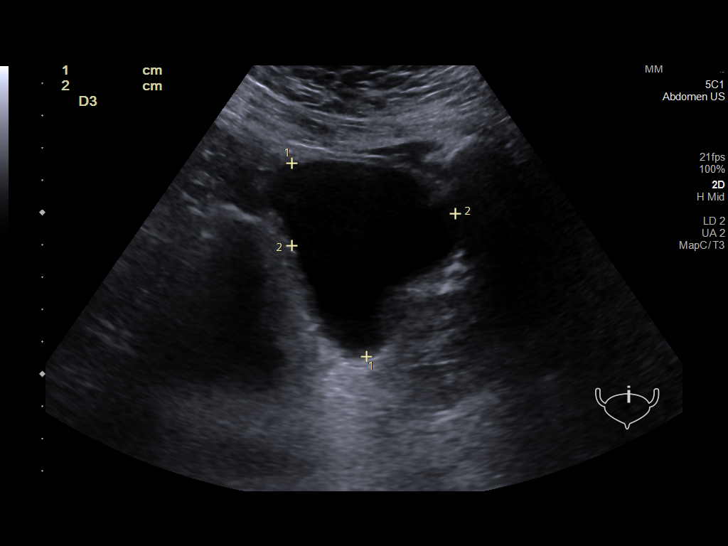
[im 7/82]
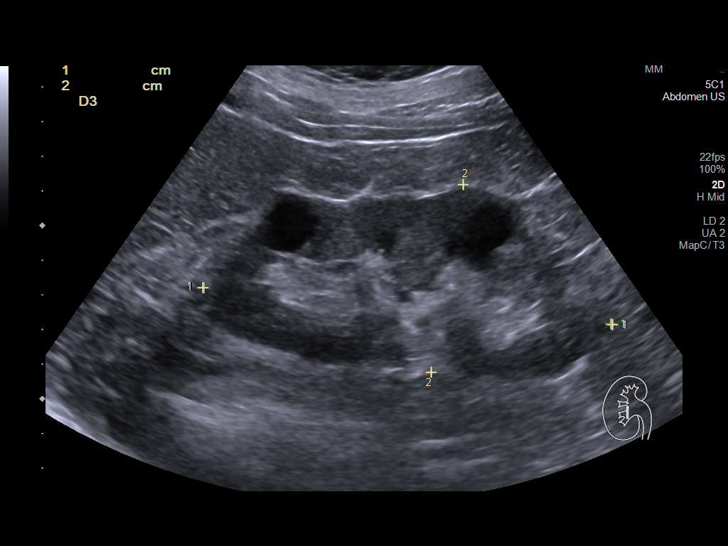
[im 14/82]
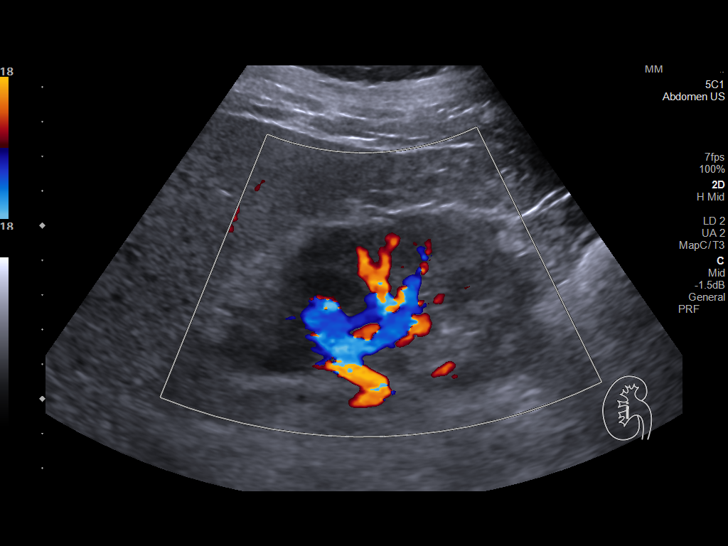
[im 21/82]
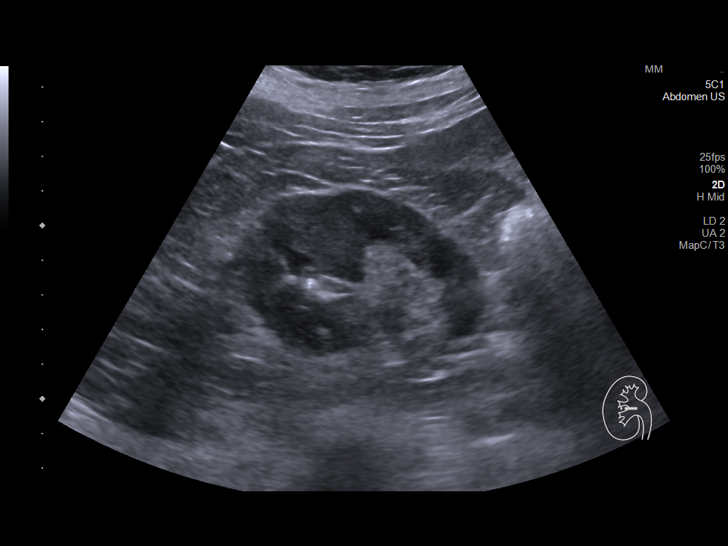
[im 28/82]
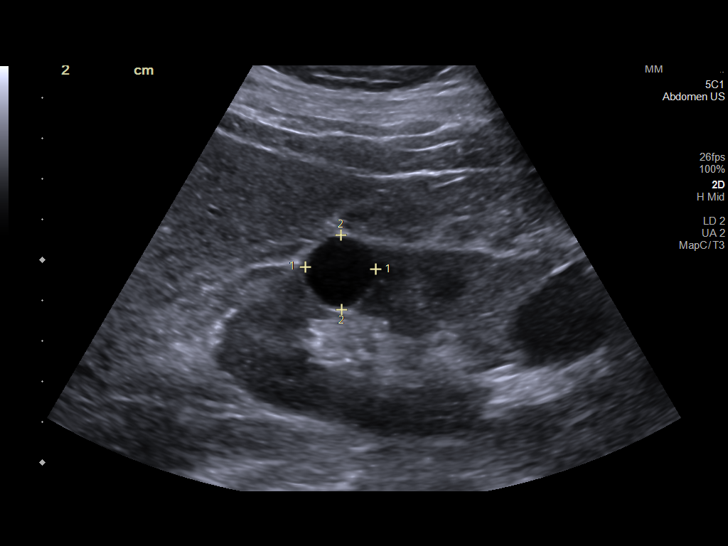
[im 31/82]
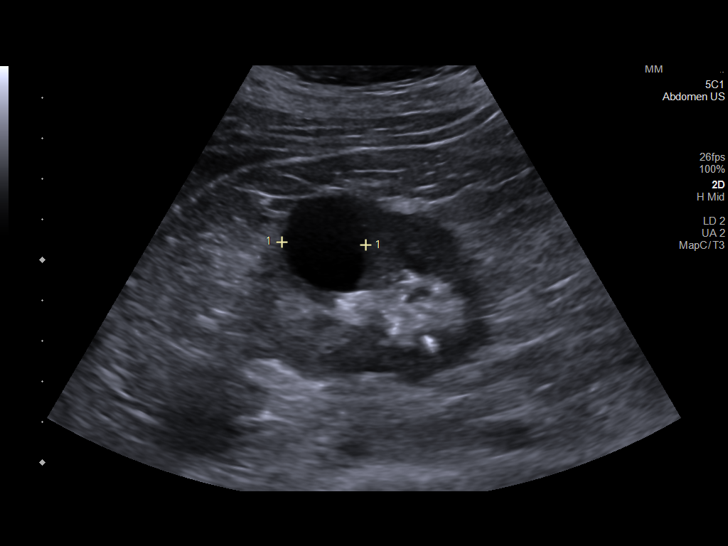
[im 38/82]
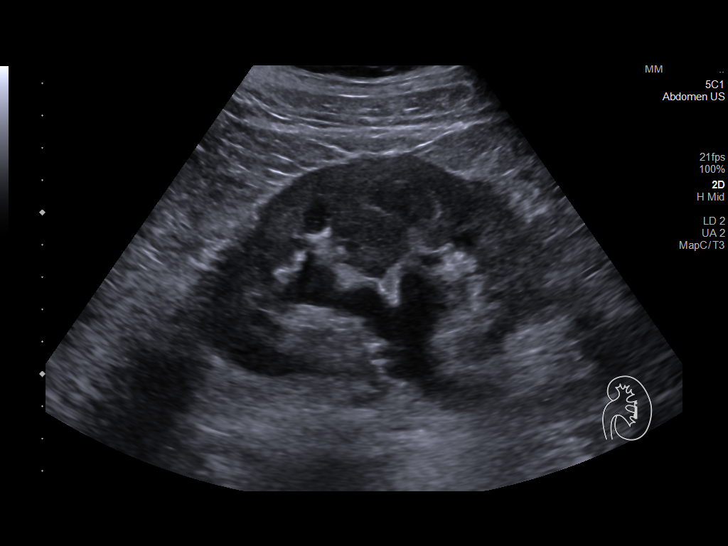
[im 44/82]
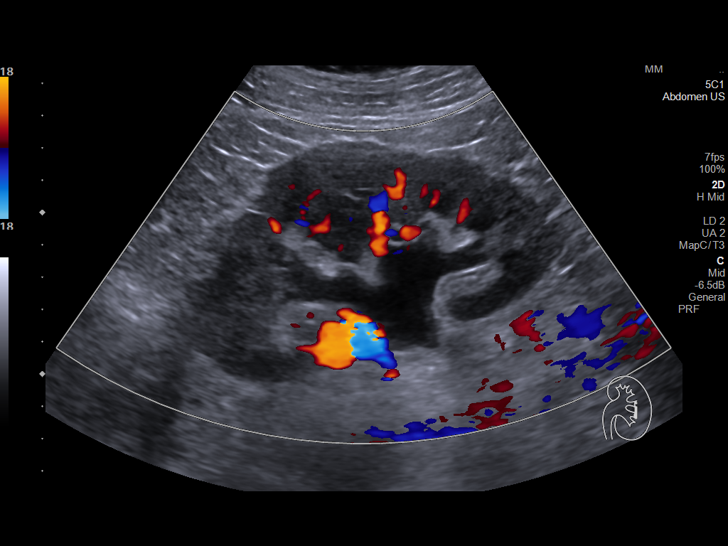
[im 51/82]
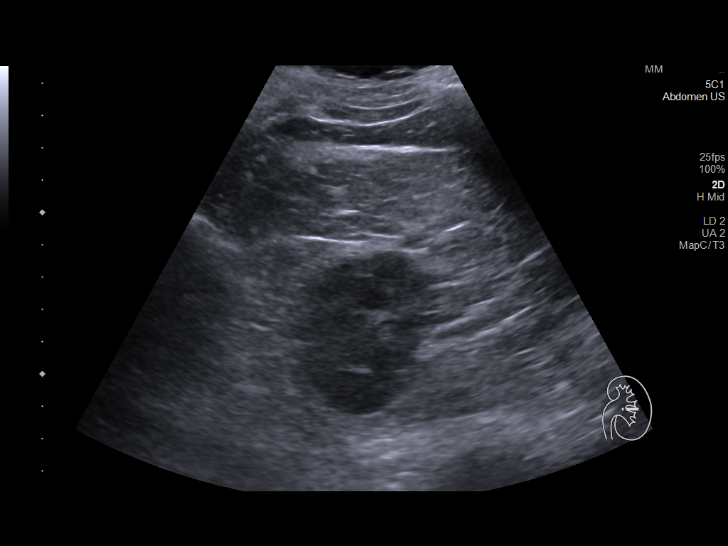
[im 55/82]
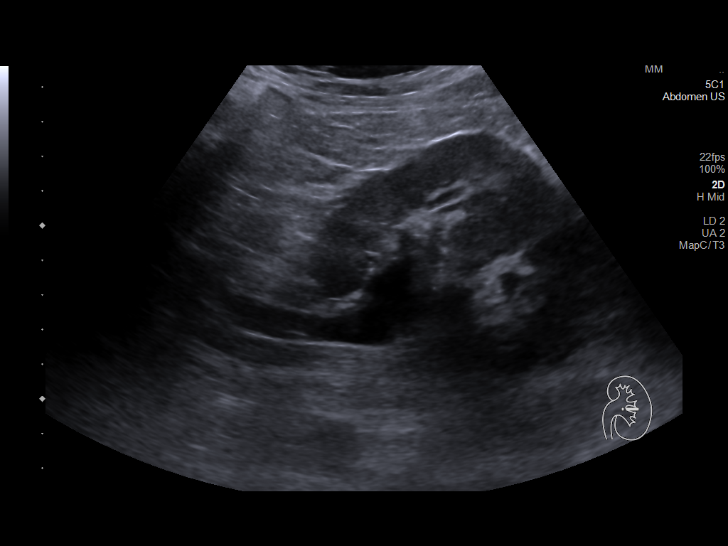
[im 61/82]
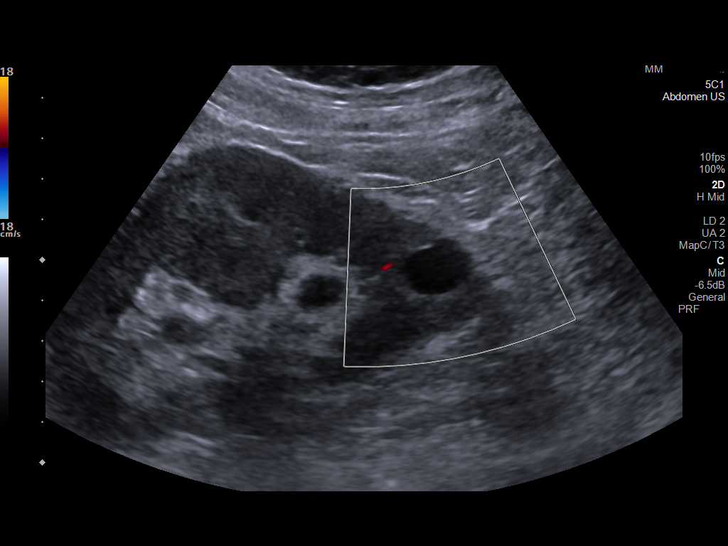
[im 68/82]
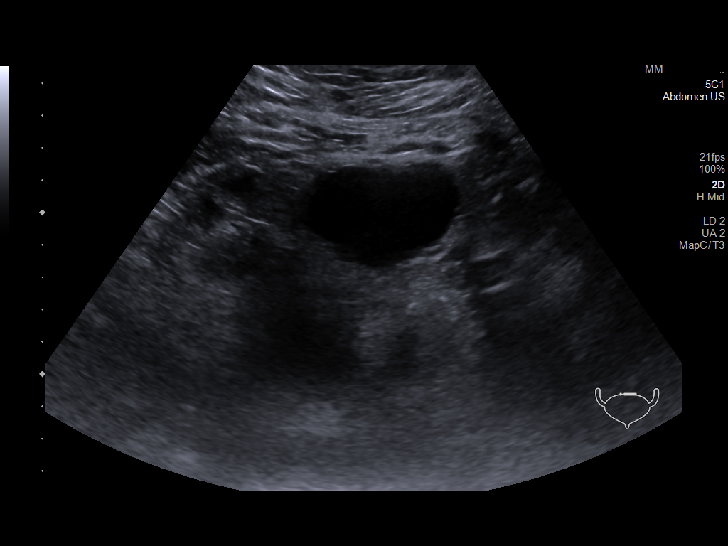
[im 75/82]
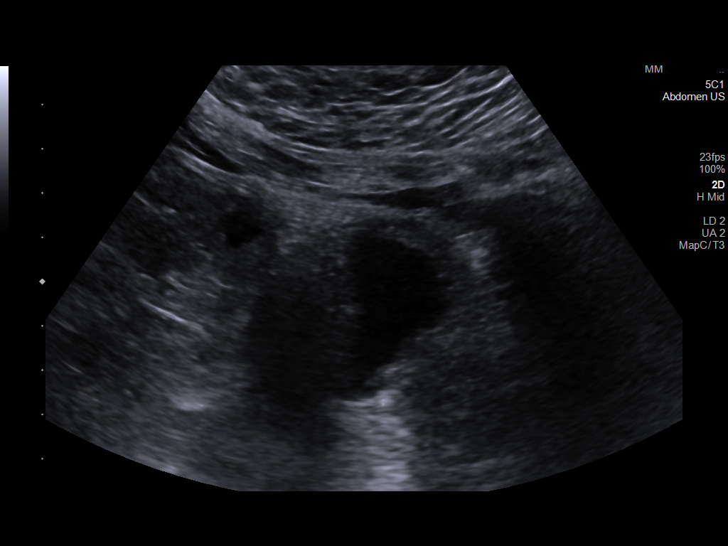
[im 82/82]
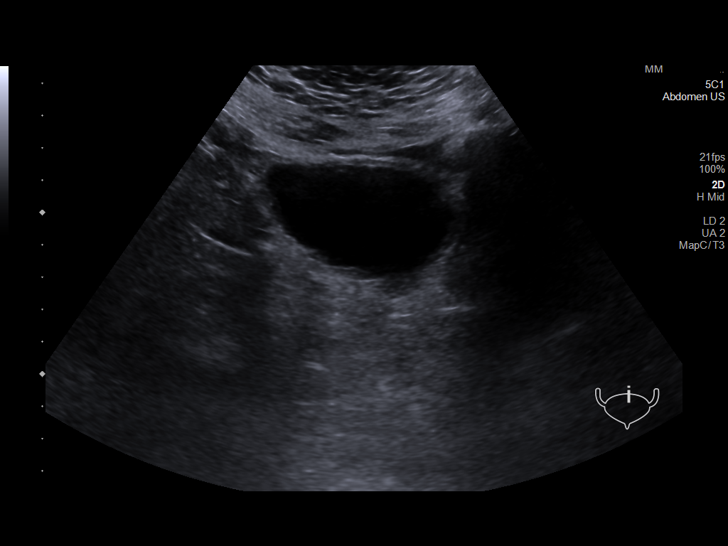

[14 of 25 positions shown; findings below may reference images not displayed]

FINDINGS: Right Kidney:

Renal measurements: 11.9 x 5.5 x 7.2 cm = volume: 246 mL.
Echogenicity normal. No hydronephrosis. Cysts, fewer than 10 within
the right kidney. Lower pole cyst measures 2.2 x 2.5 x 2.1 cm. Upper
pole cyst measures 1.7 x 1.8 x 1.9 cm.

Left Kidney:

Renal measurements: 11.9 x 6.9 x 6.4 cm = volume: 279 mL.
Echogenicity normal. Mild to moderate left hydronephrosis and
proximal hydroureter. Cysts within the left kidney, fewer than 10.
Lower pole cyst measures 1.7 x 1.3 x 1.5 cm.

Bladder:

Appears normal for degree of bladder distention.

Other:

Enlarged prostate with volume of 65 cubic cm.
IMPRESSION: 1. Mild to moderate left hydronephrosis with proximal hydroureter.
Suggest CT KUB to evaluate for distal obstruction.
2. Simple appearing bilateral renal cysts.
3. Enlarged prostate

## 2022-05-12 ENCOUNTER — Ambulatory Visit: Payer: Medicare Other | Admitting: Cardiology

## 2022-05-29 ENCOUNTER — Ambulatory Visit: Payer: Medicare Other | Attending: Cardiology | Admitting: Cardiology

## 2022-05-29 ENCOUNTER — Encounter: Payer: Self-pay | Admitting: Cardiology

## 2022-05-29 VITALS — BP 118/72 | HR 70 | Ht 68.0 in | Wt 164.2 lb

## 2022-05-29 DIAGNOSIS — I2510 Atherosclerotic heart disease of native coronary artery without angina pectoris: Secondary | ICD-10-CM

## 2022-05-29 DIAGNOSIS — I10 Essential (primary) hypertension: Secondary | ICD-10-CM | POA: Diagnosis present

## 2022-05-29 DIAGNOSIS — M791 Myalgia, unspecified site: Secondary | ICD-10-CM | POA: Insufficient documentation

## 2022-05-29 DIAGNOSIS — E782 Mixed hyperlipidemia: Secondary | ICD-10-CM | POA: Diagnosis present

## 2022-05-29 DIAGNOSIS — T466X5A Adverse effect of antihyperlipidemic and antiarteriosclerotic drugs, initial encounter: Secondary | ICD-10-CM

## 2022-05-29 NOTE — Progress Notes (Signed)
Cardiology Office Note  Date: 05/29/2022   ID: Timothy Pruitt, DOB Jun 01, 1946, MRN [id number]  PCP:  Theodoro Kos, MD  Cardiologist:  Nona Dell, MD Electrophysiologist:  None   Chief Complaint  Patient presents with   Cardiac follow-up    History of Present Illness: Timothy Pruitt is a 76 y.o. male last seen in October 2022 by Mr. Vincenza Hews NP.  He is here for a routine visit.  Reports no angina or increasing breathlessness with typical activities.  He is enjoying retirement, has been doing a lot of traveling.  Reports no specific change in stamina.  I reviewed his medications which are outlined below.  He has a history of statin intolerance, has done well with Zetia however his last LDL was only down to 107.  I did talk with him about the possibility of considering PCSK9 inhibitors, for now he wanted to hold off but will consider the matter.  I personally reviewed his ECG today which shows normal sinus rhythm.  Past Medical History:  Diagnosis Date   Aortic atherosclerosis (HCC)    CT imaging   Cataract    Combined forms OU   Depression    Essential hypertension    Macular degeneration    Wet OD, Dry OS   Mixed hyperlipidemia    Nephrolithiasis    Status post lithotripsy   Right arm fracture    Age 14   Statin intolerance    Type 2 diabetes mellitus (HCC)     Past Surgical History:  Procedure Laterality Date   APPENDECTOMY     COLONOSCOPY     LEFT HEART CATH AND CORONARY ANGIOGRAPHY N/A 09/20/2020   Procedure: LEFT HEART CATH AND CORONARY ANGIOGRAPHY;  Surgeon: Corky Crafts, MD;  Location: [facility];  Service: Cardiovascular;  Laterality: N/A;   Skin cancer resection     TONSILLECTOMY      Current Outpatient Medications  Medication Sig Dispense Refill   aspirin EC 81 MG tablet Take 81 mg by mouth daily.     buPROPion (WELLBUTRIN SR) 150 MG 12 hr tablet Take 150 mg by mouth daily.     busPIRone (BUSPAR) 15 MG tablet Take 15 mg by mouth 3  (three) times daily.     doxazosin (CARDURA) 4 MG tablet Take 4 mg by mouth daily.     ezetimibe (ZETIA) 10 MG tablet Take 1 tablet (10 mg total) by mouth daily.     Fingerstix Lancets MISC Inject into the skin.     glucose blood (ACCU-CHEK AVIVA PLUS) test strip 1 strip by external route every day     losartan (COZAAR) 100 MG tablet Take 100 mg by mouth daily.  2   metFORMIN (GLUCOPHAGE) 500 MG tablet Take 500 mg by mouth 2 (two) times daily.     metoprolol succinate (TOPROL-XL) 25 MG 24 hr tablet Take 25 mg by mouth every morning.     Multiple Vitamins-Minerals (PRESERVISION AREDS 2 PO) Take 1 tablet by mouth in the morning and at bedtime.     Omega-3 Fatty Acids (KP FISH OIL) 1200 MG CAPS Take 3,600 capsules by mouth daily.     sildenafil (REVATIO) 20 MG tablet Take 20 mg by mouth daily as needed (ED).     No current facility-administered medications for this visit.   Allergies:  Statins, Sulfa antibiotics, and Trazodone and nefazodone   ROS: No palpitations or syncope.  Physical Exam: VS:  BP 118/72   Pulse 70  Ht 5\' 8"  (1.727 m)   Wt 164 lb 3.2 oz (74.5 kg)   SpO2 97%   BMI 24.97 kg/m , BMI Body mass index is 24.97 kg/m.  Wt Readings from Last 3 Encounters:  05/29/22 164 lb 3.2 oz (74.5 kg)  05/14/21 158 lb 12.8 oz (72 kg)  10/23/20 176 lb 12.8 oz (80.2 kg)    General: Patient appears comfortable at rest. HEENT: Conjunctiva and lids normal. Neck: Supple, no elevated JVP or carotid bruits. Lungs: Clear to auscultation, nonlabored breathing at rest. Cardiac: Regular rate and rhythm, no S3 or significant systolic murmur, no pericardial rub. Extremities: No pitting edema, distal pulses 2+.  ECG:  An ECG dated 09/13/2020 was personally reviewed today and demonstrated:  Sinus rhythm.  Recent Labwork:  June 2023: Cholesterol 174, triglycerides 137, HDL 40, LDL 107, hemoglobin A1c 6.2%, TSH 2.32, potassium 4.9, BUN 21, creatinine 1.01, AST 15, ALT 13  Other Studies Reviewed  Today:  Cardiac catheterization 09/20/2020: 2nd Diag lesion is 25% stenosed. Mid LAD lesion is 10% stenosed. The left ventricular systolic function is normal. LV end diastolic pressure is normal. The left ventricular ejection fraction is 55-65% by visual estimate. There is no aortic valve stenosis.   Minimal, nonobstructive CAD.  Continue preventive therapy.   Assessment and Plan:  1.  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  He is doing well without angina and plan is to continue medical therapy and observation for now.  ECG reviewed.  Continue aspirin, Cozaar, and Zetia.  He has a history of statin intolerance.   2.  Mixed hyperlipidemia with statin intolerance, currently on Zetia and with LDL 107.  We did discuss possibility of PCSK9 inhibitors and he preferred to hold off for now.  3.  Essential hypertension, blood pressure is well controlled today on Cozaar and Toprol-XL.  Medication Adjustments/Labs and Tests Ordered: Current medicines are reviewed at length with the patient today.  Concerns regarding medicines are outlined above.   Tests Ordered: Orders Placed This Encounter  Procedures   EKG 12-Lead    Medication Changes: No orders of the defined types were placed in this encounter.   Disposition:  Follow up  1 year.  Signed, Satira Sark, MD, [facility] 05/29/2022 4:04 PM    [facility] [facility] [place] [place], [place], [place] [address] Phone: [phone]; Fax: [phone]

## 2022-05-29 NOTE — Patient Instructions (Signed)

## 2022-10-15 ENCOUNTER — Encounter (INDEPENDENT_AMBULATORY_CARE_PROVIDER_SITE_OTHER): Payer: Medicare Other | Admitting: Ophthalmology

## 2022-10-20 NOTE — Progress Notes (Signed)
[facility] - Clinic Note  10/28/2022     CHIEF COMPLAINT Patient presents for Retina Follow Up  HISTORY OF PRESENT ILLNESS: Timothy Pruitt is a 77 y.o. male who presents to the clinic today for:   HPI     Retina Follow Up   Patient presents with  Dry AMD.  In both eyes.  This started 9 months ago.  Duration of 9 months.  Since onset it is stable.  I, the attending physician,  performed the HPI with the patient and updated documentation appropriately.        Comments   9 month retina follow up AMD OD pt is reporting OD vision get blurred from time to time but does go away he does floaters but denies flashes of light       Last edited by Rennis Chris, MD on 10/30/2022  1:52 AM.    Pt states he has intermittent blurry vision  Referring physician: Theodoro Kos, MD [id number] [place],  [place] [address]  HISTORICAL INFORMATION:   Selected notes from the MEDICAL RECORD NUMBER Referred by Dr. [name] for concern of exudative ARMD OD   CURRENT MEDICATIONS: No current outpatient medications on file. (Ophthalmic Drugs)   No current facility-administered medications for this visit. (Ophthalmic Drugs)   Current Outpatient Medications (Other)  Medication Sig   aspirin EC 81 MG tablet Take 81 mg by mouth daily.   buPROPion (WELLBUTRIN SR) 150 MG 12 hr tablet Take 150 mg by mouth daily.   busPIRone (BUSPAR) 15 MG tablet Take 15 mg by mouth 3 (three) times daily.   doxazosin (CARDURA) 4 MG tablet Take 4 mg by mouth daily.   ezetimibe (ZETIA) 10 MG tablet Take 1 tablet (10 mg total) by mouth daily.   Fingerstix Lancets MISC Inject into the skin.   glucose blood (ACCU-CHEK AVIVA PLUS) test strip 1 strip by external route every day   losartan (COZAAR) 100 MG tablet Take 100 mg by mouth daily.   metFORMIN (GLUCOPHAGE) 500 MG tablet Take 500 mg by mouth 2 (two) times daily.   metoprolol succinate (TOPROL-XL) 25 MG 24 hr tablet Take 25 mg by mouth  every morning.   Multiple Vitamins-Minerals (PRESERVISION AREDS 2 PO) Take 1 tablet by mouth in the morning and at bedtime.   Omega-3 Fatty Acids (KP FISH OIL) 1200 MG CAPS Take 3,600 capsules by mouth daily.   sildenafil (REVATIO) 20 MG tablet Take 20 mg by mouth daily as needed (ED).   No current facility-administered medications for this visit. (Other)   REVIEW OF SYSTEMS: ROS   Positive for: Genitourinary, Endocrine, Eyes, Psychiatric Negative for: Constitutional, Gastrointestinal, Neurological, Skin, Musculoskeletal, HENT, Cardiovascular, Respiratory, Allergic/Imm, Heme/Lymph Last edited by Etheleen Mayhew, COT on 10/28/2022  1:49 PM.      ALLERGIES Allergies  Allergen Reactions   Statins Other (See Comments)    Muscle pain   Sulfa Antibiotics Hives   Trazodone And Nefazodone Swelling   PAST MEDICAL HISTORY Past Medical History:  Diagnosis Date   Aortic atherosclerosis (HCC)    CT imaging   Cataract    Combined forms OU   Depression    Essential hypertension    Macular degeneration    Wet OD, Dry OS   Mixed hyperlipidemia    Nephrolithiasis    Status post lithotripsy   Right arm fracture    Age 49   Statin intolerance    Type 2 diabetes mellitus (HCC)  Past Surgical History:  Procedure Laterality Date   APPENDECTOMY     COLONOSCOPY     LEFT HEART CATH AND CORONARY ANGIOGRAPHY N/A 09/20/2020   Procedure: LEFT HEART CATH AND CORONARY ANGIOGRAPHY;  Surgeon: Corky Crafts, MD;  Location: [facility] INVASIVE CV LAB;  Service: Cardiovascular;  Laterality: N/A;   Skin cancer resection     TONSILLECTOMY     FAMILY HISTORY Family History  Problem Relation Age of Onset   Cataracts Mother    Fuch's dystrophy Mother    Glaucoma Mother    Stroke Mother    Cancer Mother    Diabetes Brother    CAD Father    Arthritis Father    SOCIAL HISTORY Social History   Tobacco Use   Smoking status: Former    Types: Cigarettes   Smokeless tobacco: Never   Tobacco  comments:    Quit 1995  Vaping Use   Vaping Use: Never used  Substance Use Topics   Alcohol use: Yes   Drug use: Never       OPHTHALMIC EXAM:  Base Eye Exam     Visual Acuity (Snellen - Linear)       Right Left   Dist cc 20/20 -2 20/40 -2   Dist ph cc  NI    Correction: Glasses         Tonometry (Tonopen, 1:53 PM)       Right Left   Pressure 12 13         Pupils       Pupils Dark Light Shape React APD   Right PERRL 3 2 Round Brisk None   Left PERRL 3 2 Round Brisk None         Visual Fields       Left Right    Full Full         Extraocular Movement       Right Left    Full, Ortho Full, Ortho         Neuro/Psych     Oriented x3: Yes   Mood/Affect: Normal         Dilation     Both eyes: 2.5% Phenylephrine @ 1:53 PM           Slit Lamp and Fundus Exam     Slit Lamp Exam       Right Left   Lids/Lashes Dermatochalasis - upper lid Dermatochalasis - upper lid   Conjunctiva/Sclera White and quiet White and quiet   Cornea Arcus, well healed cataract wound, mild tear film debris, trace PEE, mild arcus Arcus, trace tear film debris, well healed cataract wound   Anterior Chamber deep and clear deep and clear   Iris Round and dilated Round and dilated   Lens PC IOL in good position, trace Posterior capsular opacification PC IOL in good position, trace Posterior capsular opacification   Anterior Vitreous Vitreous syneresis, Posterior vitreous detachment, vitreous condensations, vitreous debris, Weiss ring mild syneresis         Fundus Exam       Right Left   Disc Compact, mild Temporal Peripapillary atrophy, Pink and Sharp Pink and Sharp, mild Temporal Peripapillary atrophy, Compact   C/D Ratio 0.1 0.2   Macula Blunted foveal reflex, central PED, central pigment clumping, RPE mottling and clumping, Drusen, no heme or edema Blunted foveal reflex, Epiretinal membrane, RPE mottling /  Clumping / atophy, Drusen, No heme or edema, patch of  RPE atrophy IT macula -- stable  Vessels attenuated, mild AV crossing changes attenuated, mild tortuosity   Periphery Attached, scattered RPE changes, no heme, peripheral drusen, mild reticular degeneration, no RT/RD Attached, scattered peripheral drusen, No RT/RD, no heme           Refraction     Wearing Rx       Sphere Cylinder Axis   Right -3.25 +0.50 016   Left -2.75 Sphere     Type: SVL            IMAGING AND PROCEDURES  Imaging and Procedures for @TODAY @  OCT, Retina - OU - Both Eyes       Right Eye Quality was good. Central Foveal Thickness: 323. Progression has been stable. Findings include no IRF, no SRF, abnormal foveal contour, myopic contour, retinal drusen , subretinal hyper-reflective material, epiretinal membrane, pigment epithelial detachment (Stable central PED w/ stable improvement in overlying ellipsoid signal, stable ERM).   Left Eye Quality was good. Central Foveal Thickness: 280. Progression has been stable. Findings include normal foveal contour, no IRF, no SRF, retinal drusen , epiretinal membrane, pigment epithelial detachment, inner retinal atrophy, outer retinal atrophy (Persistent ERM; Stable patch of ORA and overlying atrophy IT macula).   Notes *Images captured and stored on drive  Diagnosis / Impression:  OD: Exudative ARMD - no IRF/SRF; stable central PED.  OS: Non- Exudative ARMD with early atrophy; ERM -- persistent; Stable patch of ORA and overlying atrophy IT macula   Clinical management:  See below  Abbreviations: NFP - Normal foveal profile. CME - cystoid macular edema. PED - pigment epithelial detachment. IRF - intraretinal fluid. SRF - subretinal fluid. EZ - ellipsoid zone. ERM - epiretinal membrane. ORA - outer retinal atrophy. ORT - outer retinal tubulation. SRHM - subretinal hyper-reflective material             ASSESSMENT/PLAN:    ICD-10-CM   1. Exudative age-related macular degeneration of right eye with active  choroidal neovascularization (HCC)  H35.3211 OCT, Retina - OU - Both Eyes    2. Intermediate stage nonexudative age-related macular degeneration of left eye  H35.3122     3. Epiretinal membrane (ERM) of both eyes  H35.373     4. Posterior vitreous detachment of right eye  H43.811     5. Pseudophakia, both eyes  Z96.1       1. Exudative age related macular degeneration, OD -- stably improved  - at initial presentation, pt endorsed metamorphopsia   - OCT OD with PED and overlying SRF + drusen  - S/P IVA OD #1 (05.08.19) early resolution, #2 (06.06.19) #3 (07.03.19), #4 (08.13.19), #5 (10.08.19), #6 (12.13.19), #7 (03.12.20),#8 (07.02.20), #9 (10.20.20), #10 (02.15.21), #11 (06.16.21)  - pt reports stable resolution of metamorphopsia   - OCT shows stable resolution of SRF and stable PED despite no IVA since 06.16.21  - BCVA 20/20 OD  - exam stable without heme  - recommend holding IVA OD today  - pt in agreement  - f/u in 9-12 months, sooner prn -- DFE/OCT  2. Age related macular degeneration, non-exudative, OS  - The incidence, anatomy, and pathology of dry AMD, risk of progression, and the AREDS and AREDS 2 study including smoking risks discussed with patient.  - cont amsler grid monitoring  3. Epiretinal membrane, OU  - mild ERM  - asymptomatic, no metamorphopsia  - no indication for surgery at this time  - monitor for now  4. PVD OD  - flashes/floaters symptoms improved OD  - OCT  shows stable release of PVD OD, OS with partial PVD still attached to disc  - Discussed findings and prognosis  - concern for impending PVD OS  - No RT or RD on repeat 360 peripheral exam OU  - Reviewed s/s of RT/RD  - Strict return precautions for any such RT/RD signs/symptoms  - monitor  5. Pseudophakia OU  - s/p CE/IOL (Summer '22, [facility])  - IOL in good position, doing well  - monitor  Ophthalmic Meds Ordered this visit:  No orders of the defined types were placed in this  encounter.    Return for f/u 9-12 months, exu ARMD OD, DFE, OCT.  There are no Patient Instructions on file for this visit.  This document serves as a record of services personally performed by Karie Chimera, MD, PhD. It was created on their behalf by Gerilyn Nestle, COT an ophthalmic technician. The creation of this record is the provider's dictation and/or activities during the visit.    Electronically signed by:  Gerilyn Nestle, COT  03.18.24 1:54 AM  This document serves as a record of services personally performed by Karie Chimera, MD, PhD. It was created on their behalf by Glee Arvin. Manson Passey, OA an ophthalmic technician. The creation of this record is the provider's dictation and/or activities during the visit.    Electronically signed by: Glee Arvin. Manson Passey, [place] 03.26.2024 1:54 AM  Karie Chimera, M.D., Ph.D. Diseases & Surgery of the Retina and Vitreous [facility]  I have reviewed the above documentation for accuracy and completeness, and I agree with the above. Karie Chimera, M.D., Ph.D. 10/30/22 2:23 AM    Abbreviations: M myopia (nearsighted); A astigmatism; H hyperopia (farsighted); P presbyopia; Mrx spectacle prescription;  CTL contact lenses; OD right eye; OS left eye; OU both eyes  XT exotropia; ET esotropia; PEK punctate epithelial keratitis; PEE punctate epithelial erosions; DES dry eye syndrome; MGD meibomian gland dysfunction; ATs artificial tears; PFAT's preservative free artificial tears; NSC nuclear sclerotic cataract; PSC posterior subcapsular cataract; ERM epi-retinal membrane; PVD posterior vitreous detachment; RD retinal detachment; DM diabetes mellitus; DR diabetic retinopathy; NPDR non-proliferative diabetic retinopathy; PDR proliferative diabetic retinopathy; CSME clinically significant macular edema; DME diabetic macular edema; dbh dot blot hemorrhages; CWS cotton wool spot; POAG primary open angle glaucoma; C/D cup-to-disc ratio;  HVF humphrey visual field; GVF goldmann visual field; OCT optical coherence tomography; IOP intraocular pressure; BRVO Branch retinal vein occlusion; CRVO central retinal vein occlusion; CRAO central retinal artery occlusion; BRAO branch retinal artery occlusion; RT retinal tear; SB scleral buckle; PPV pars plana vitrectomy; VH Vitreous hemorrhage; PRP panretinal laser photocoagulation; IVK intravitreal kenalog; VMT vitreomacular traction; MH Macular hole;  NVD neovascularization of the disc; NVE neovascularization elsewhere; AREDS age related eye disease study; ARMD age related macular degeneration; POAG primary open angle glaucoma; EBMD epithelial/anterior basement membrane dystrophy; ACIOL anterior chamber intraocular lens; IOL intraocular lens; PCIOL posterior chamber intraocular lens; Phaco/IOL phacoemulsification with intraocular lens placement; PRK photorefractive keratectomy; LASIK laser assisted in situ keratomileusis; HTN hypertension; DM diabetes mellitus; COPD chronic obstructive pulmonary disease

## 2022-10-21 ENCOUNTER — Encounter (INDEPENDENT_AMBULATORY_CARE_PROVIDER_SITE_OTHER): Payer: Medicare Other | Admitting: Ophthalmology

## 2022-10-21 DIAGNOSIS — H353122 Nonexudative age-related macular degeneration, left eye, intermediate dry stage: Secondary | ICD-10-CM

## 2022-10-21 DIAGNOSIS — H35373 Puckering of macula, bilateral: Secondary | ICD-10-CM

## 2022-10-21 DIAGNOSIS — Z961 Presence of intraocular lens: Secondary | ICD-10-CM

## 2022-10-21 DIAGNOSIS — H353211 Exudative age-related macular degeneration, right eye, with active choroidal neovascularization: Secondary | ICD-10-CM

## 2022-10-21 DIAGNOSIS — H43811 Vitreous degeneration, right eye: Secondary | ICD-10-CM

## 2022-10-28 ENCOUNTER — Ambulatory Visit (INDEPENDENT_AMBULATORY_CARE_PROVIDER_SITE_OTHER): Payer: Medicare Other | Admitting: Ophthalmology

## 2022-10-28 DIAGNOSIS — H353211 Exudative age-related macular degeneration, right eye, with active choroidal neovascularization: Secondary | ICD-10-CM

## 2022-10-28 DIAGNOSIS — Z961 Presence of intraocular lens: Secondary | ICD-10-CM

## 2022-10-28 DIAGNOSIS — H35373 Puckering of macula, bilateral: Secondary | ICD-10-CM | POA: Diagnosis not present

## 2022-10-28 DIAGNOSIS — H43811 Vitreous degeneration, right eye: Secondary | ICD-10-CM | POA: Diagnosis not present

## 2022-10-28 DIAGNOSIS — H353122 Nonexudative age-related macular degeneration, left eye, intermediate dry stage: Secondary | ICD-10-CM

## 2022-10-30 ENCOUNTER — Encounter (INDEPENDENT_AMBULATORY_CARE_PROVIDER_SITE_OTHER): Payer: Self-pay | Admitting: Ophthalmology

## 2023-08-11 ENCOUNTER — Ambulatory Visit: Payer: Medicare Other | Attending: Cardiology | Admitting: Cardiology

## 2023-08-11 ENCOUNTER — Encounter: Payer: Self-pay | Admitting: Cardiology

## 2023-08-11 VITALS — BP 120/74 | HR 65 | Ht 68.0 in | Wt 170.8 lb

## 2023-08-11 DIAGNOSIS — I2510 Atherosclerotic heart disease of native coronary artery without angina pectoris: Secondary | ICD-10-CM | POA: Insufficient documentation

## 2023-08-11 DIAGNOSIS — I10 Essential (primary) hypertension: Secondary | ICD-10-CM | POA: Insufficient documentation

## 2023-08-11 DIAGNOSIS — T466X5A Adverse effect of antihyperlipidemic and antiarteriosclerotic drugs, initial encounter: Secondary | ICD-10-CM | POA: Diagnosis present

## 2023-08-11 DIAGNOSIS — E782 Mixed hyperlipidemia: Secondary | ICD-10-CM | POA: Insufficient documentation

## 2023-08-11 DIAGNOSIS — M791 Myalgia, unspecified site: Secondary | ICD-10-CM | POA: Insufficient documentation

## 2023-08-11 DIAGNOSIS — T466X5D Adverse effect of antihyperlipidemic and antiarteriosclerotic drugs, subsequent encounter: Secondary | ICD-10-CM

## 2023-08-11 MED ORDER — NEXLIZET 180-10 MG PO TABS: 1.0000 | ORAL_TABLET | Freq: Every day | ORAL | Status: AC

## 2023-08-11 MED ORDER — NEXLIZET 180-10 MG PO TABS: 1.0000 | ORAL_TABLET | Freq: Every day | ORAL | Status: DC

## 2023-08-11 MED ORDER — NEXLIZET 180-10 MG PO TABS: 1.0000 | ORAL_TABLET | Freq: Every day | ORAL | 6 refills | Status: DC

## 2023-08-11 NOTE — Patient Instructions (Addendum)
 Medication Instructions:  Your physician has recommended you make the following change in your medication Stop ezetimibe  (zetia ) Start Nexlizet  180 mg/10 mg daily Continue all other medications as prescribed  Labwork: none  Testing/Procedures: none  Follow-Up: Your physician recommends that you schedule a follow-up appointment in: 1 year. You will receive a reminder call in about 10 months reminding you to schedule your appointment. If you don't receive this call, please contact our office.  Any Other Special Instructions Will Be Listed Below (If Applicable).  If you need a refill on your cardiac medications before your next appointment, please call your pharmacy.

## 2023-08-11 NOTE — Progress Notes (Signed)
    Cardiology Office Note  Date: 08/11/2023   ID: Timothy Pruitt, DOB 1946-07-31, MRN [id number]  History of Present Illness: Timothy Pruitt is a 78 y.o. male last seen in October 2023.  He is here for a routine visit.  Reports no exertional chest pain or change in stamina.  Continues to travel and retirement.  I reviewed his medications.  Current cardiovascular regimen includes aspirin , Toprol-XL, omega-3 supplements, and Zetia .  I went over his interval lab work, LDL was 91 in September 2024.  We discussed considering a switch from Zetia  to Nexlizet .  His blood pressure is well-controlled today.  ECG today shows normal sinus rhythm with nonspecific ST changes.  Physical Exam: VS:  BP 120/74   Pulse 65   Ht 5' 8 (1.727 m)   Wt 170 lb 12.8 oz (77.5 kg)   SpO2 90%   BMI 25.97 kg/m , BMI Body mass index is 25.97 kg/m.  Wt Readings from Last 3 Encounters:  08/11/23 170 lb 12.8 oz (77.5 kg)  05/29/22 164 lb 3.2 oz (74.5 kg)  05/14/21 158 lb 12.8 oz (72 kg)    General: Patient appears comfortable at rest. HEENT: Conjunctiva and lids normal. Neck: Supple, no elevated JVP or carotid bruits. Lungs: Clear to auscultation, nonlabored breathing at rest. Cardiac: Regular rate and rhythm, no S3 or significant systolic murmur. Abdomen: Soft, bowel sounds present, no bruits. Extremities: No pitting edema.  ECG:  An ECG dated 05/29/2022 was personally reviewed today and demonstrated:  Sinus rhythm.  Labwork:  September 2024: Hemoglobin A1c 6.2%, cholesterol 170, triglycerides 216, HDL 36, LDL 91, TSH 2.877  Other Studies Reviewed Today:  No interval cardiac testing for review today.  Assessment and Plan:  1.  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  Remains asymptomatic.  ECG reviewed today.  Would continue with observation on medical therapy aimed at risk reduction.  Consider walking plan for exercise.  2.  Mixed hyperlipidemia with statin intolerance.  LDL  91 in September 2024.  Plan to switch from Zetia  to Nexlizet .  3.  Primary hypertension.  Blood pressure is well-controlled today.  Disposition:  Follow up  1 year.  Signed, Timothy Pruitt, M.D., F.A.C.C. [facility]

## 2023-08-17 ENCOUNTER — Telehealth: Payer: Self-pay | Admitting: Cardiology

## 2023-08-17 NOTE — Telephone Encounter (Signed)
 Pt c/o medication issue:  1. Name of Medication:   Bempedoic Acid-Ezetimibe  (NEXLIZET ) 180-10 MG TABS    2. How are you currently taking this medication (dosage and times per day)?  Take 1 tablet by mouth daily.       3. Are you having a reaction (difficulty breathing--STAT)? No  4. What is your medication issue? Pt's spouse Katheryn is requesting a callback regarding pharmacy tell them a PA is needed for this medication. Please advise

## 2023-08-18 ENCOUNTER — Telehealth: Payer: Self-pay | Admitting: Pharmacy Technician

## 2023-08-18 NOTE — Telephone Encounter (Signed)
 Pharmacy Patient Advocate Encounter   Received notification from Pt Calls Messages that prior authorization for nexlizet  is required/requested.   Insurance verification completed.   The patient is insured through [facility] [facility] [employer] [facility]   Per test claim: PA required; PA submitted to above mentioned insurance via CoverMyMeds Key/confirmation #/EOC [facility] Status is pending

## 2023-08-19 ENCOUNTER — Other Ambulatory Visit (HOSPITAL_COMMUNITY): Payer: Self-pay

## 2023-08-19 ENCOUNTER — Encounter: Payer: Self-pay | Admitting: Pharmacist

## 2023-08-19 NOTE — Telephone Encounter (Signed)
 Spoke with patient who states that his cost is going to >400. Asking about copay card. Enrolled him in [employer]. Info sent in [place] message Card No. [id number] Card Status Active BIN [id number] PCN [id number] [employer] [id number]

## 2023-08-19 NOTE — Telephone Encounter (Signed)
 Pharmacy Patient Advocate Encounter  Received notification from [employer] that Prior Authorization for nexlizet  has been APPROVED from 08/18/23 to 02/15/24   PA #/Case ID/Reference #: [id number]

## 2023-10-27 ENCOUNTER — Encounter (INDEPENDENT_AMBULATORY_CARE_PROVIDER_SITE_OTHER): Payer: Medicare Other | Admitting: Ophthalmology

## 2024-01-18 ENCOUNTER — Other Ambulatory Visit (HOSPITAL_COMMUNITY): Payer: Self-pay

## 2024-01-18 ENCOUNTER — Telehealth: Payer: Self-pay | Admitting: Pharmacy Technician

## 2024-01-18 NOTE — Telephone Encounter (Signed)
 Pharmacy Patient Advocate Encounter  Received notification from [facility] that Prior Authorization for Nexlizet  has been APPROVED from 01/18/24 to 07/19/24. Ran test claim, Copay is $364.90 one month. This test claim was processed through [facility]- copay amounts may vary at other pharmacies due to pharmacy/plan contracts, or as the patient moves through the different stages of their insurance plan.   PA #/Case ID/Reference #: [id number]

## 2024-01-18 NOTE — Telephone Encounter (Signed)
 Pharmacy Patient Advocate Encounter   Received notification from CoverMyMeds that prior authorization for Nexlizet  is required/requested.   Insurance verification completed.   The patient is insured through [employer] .   Per test claim: PA required; PA submitted to above mentioned insurance via CoverMyMeds Key/confirmation #/EOC [id number] Status is pending

## 2024-09-13 ENCOUNTER — Ambulatory Visit: Admitting: Cardiology
